# Patient Record
Sex: Male | Born: 1970
Health system: Southern US, Community
[De-identification: ages and names within clinical notes are randomized; demographics above are authoritative.]

## PROBLEM LIST (undated history)

## (undated) DIAGNOSIS — K219 Gastro-esophageal reflux disease without esophagitis: Secondary | ICD-10-CM

## (undated) DIAGNOSIS — K602 Anal fissure, unspecified: Secondary | ICD-10-CM

## (undated) DIAGNOSIS — G4730 Sleep apnea, unspecified: Secondary | ICD-10-CM

## (undated) DIAGNOSIS — T7840XA Allergy, unspecified, initial encounter: Secondary | ICD-10-CM

## (undated) HISTORY — DX: Gastro-esophageal reflux disease without esophagitis: K21.9

## (undated) HISTORY — PX: HAND SURGERY: SHX662

## (undated) HISTORY — PX: HERNIA REPAIR: SHX51

## (undated) HISTORY — PX: WISDOM TOOTH EXTRACTION: SHX21

## (undated) HISTORY — DX: Sleep apnea, unspecified: G47.30

## (undated) HISTORY — DX: Anal fissure, unspecified: K60.2

## (undated) HISTORY — DX: Allergy, unspecified, initial encounter: T78.40XA

---

## 2003-01-08 HISTORY — PX: HERNIA REPAIR: SHX51

## 2004-01-12 ENCOUNTER — Ambulatory Visit (HOSPITAL_COMMUNITY): Admission: RE | Admit: 2004-01-12 | Discharge: 2004-01-12 | Payer: Self-pay | Admitting: Surgery

## 2009-01-07 HISTORY — PX: UPPER GASTROINTESTINAL ENDOSCOPY: SHX188

## 2010-11-09 ENCOUNTER — Encounter: Payer: Self-pay | Admitting: Internal Medicine

## 2010-12-19 ENCOUNTER — Ambulatory Visit (INDEPENDENT_AMBULATORY_CARE_PROVIDER_SITE_OTHER): Payer: Federal, State, Local not specified - PPO | Admitting: Internal Medicine

## 2010-12-19 ENCOUNTER — Encounter: Payer: Self-pay | Admitting: Internal Medicine

## 2010-12-19 VITALS — BP 120/78 | HR 64 | Ht 69.0 in | Wt 193.0 lb

## 2010-12-19 DIAGNOSIS — R1310 Dysphagia, unspecified: Secondary | ICD-10-CM

## 2010-12-19 DIAGNOSIS — K219 Gastro-esophageal reflux disease without esophagitis: Secondary | ICD-10-CM

## 2010-12-19 NOTE — Progress Notes (Signed)
Cole Jones 10/16/1970 MRN [id number]    History of Present Illness:  This is a 40 year old white male with chronic gastroesophageal reflux and a globus sensation in the back of his throat which he describes as mucus or foamy secretions which he has to spit out occasionally. Solid food sometimes hesitates but he denies any dysphagia to liquids. She was initially treated with Prilosec 20 mg daily with complete relief of symptoms but at the advice of his physician, he switched  to Zantac 150 mg once a day and subsequently twice a day to avoid the risk of osteoporosis. He has a positive family history of gastroesophageal reflux in his father, brother and grandfather. 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 He moved here from [place], [place] several years ago. An upper endoscopy showed mild reflux esophagitis with no stricture.  Past Medical History  Diagnosis Date  . GERD (gastroesophageal reflux disease)   . Anal fissure     Hx of    Past Surgical History  Procedure Date  . Hernia repair     reports that he has quit smoking. He has never used smokeless tobacco. He reports that he drinks alcohol. He reports that he does not use illicit drugs. family history is negative for Colon cancer. No Known Allergies      Review of Systems: positive for solid food dysphagia and occasional heartburn. Denies abdominal pain by this epigastric discomfort. No change in bowel habits  The remainder of the 10 point ROS is negative except as outlined in H&P   Physical Exam: General appearance  Well developed, in no distress. Eyes- non icteric. HEENT nontraumatic, normocephalic. Mouth no lesions, tongue papillated, no cheilosis. Neck supple without adenopathy, thyroid not enlarged, no carotid bruits, no JVD. Lungs Clear to auscultation bilaterally. Cor normal S1, normal S2, regular rhythm, no murmur,  quiet precordium. Abdomen:  Both with minimal  tenderness in epigastrium. Normal active bowel sounds. No distention. No mass. Liver edge at costal margin. Rectal: not done Extremities no pedal edema. Skin no lesions. Neurological alert and oriented x 3. Psychological normal mood and affect.  Assessment and Plan:  Problem #1 Chronic gastroesophageal reflux is likely causing globus sensation. We need to rule out Schatzki's ring, esophageal web or stricture. We have discussed a barium esophagram to assess his motility. We need to rule out stricture and also assess the magnitude of his reflux. He discussed the use of PPIs as opposed to Zantac. We will go ahead with a barium esophagram and depending on the results may switch him to PPI.   12/19/2010 Lina Sar

## 2010-12-19 NOTE — Patient Instructions (Signed)
You have been scheduled for a Barium Esophogram at [facility] (1st floor of the hospital) on 01/10/11 at 9:30 am. Please arrive 15 minutes prior to your appointment for registration. Make certain not to have anything to eat or drink 6 hours prior to your test. If you need to reschedule for any reason, please contact radiology at [phone] to do so.  Diet for GERD  Nutrition therapy can help ease the discomfort of gastroesophageal reflux disease (GERD) and peptic ulcer disease (PUD).  HOME CARE INSTRUCTIONS   Eat your meals slowly, in a relaxed setting.   Eat 5 to 6 small meals per day.   If a food causes distress, stop eating it for a period of time.  FOODS TO AVOID  Coffee, regular or decaffeinated.   Cola beverages, regular or low calorie.   Tea, regular or decaffeinated.   Pepper.   Cocoa.   High fat foods, including meats.   Butter, margarine, hydrogenated oil (trans fats).   Peppermint or spearmint (if you have GERD).   Fruits and vegetables if not tolerated.   Alcohol.   Nicotine (smoking or chewing). This is one of the most potent stimulants to acid production in the gastrointestinal tract.   Any food that seems to aggravate your condition.  If you have questions regarding your diet, ask your caregiver or a registered dietitian. TIPS  Lying flat may make symptoms worse. Keep the head of your bed raised 6 to 9 inches (15 to 23 cm) by using a foam wedge or blocks under the legs of the bed.   Do not lay down until 3 hours after eating a meal.   Daily physical activity may help reduce symptoms.  MAKE SURE YOU:   Understand these instructions.   Will watch your condition.   Will get help right away if you are not doing well or get worse.  Document Released: 12/24/2004 Document Revised: 09/05/2010 Document Reviewed: 05/09/2008 [facility] Patient Information 2012 [place], [place].

## 2011-01-10 ENCOUNTER — Ambulatory Visit (HOSPITAL_COMMUNITY)
Admission: RE | Admit: 2011-01-10 | Discharge: 2011-01-10 | Disposition: A | Discharge status: Home / Self Care | Payer: Federal, State, Local not specified - PPO | Source: Ambulatory Visit | Attending: Internal Medicine | Admitting: Internal Medicine

## 2011-01-10 DIAGNOSIS — K219 Gastro-esophageal reflux disease without esophagitis: Secondary | ICD-10-CM | POA: Insufficient documentation

## 2011-01-10 DIAGNOSIS — R1310 Dysphagia, unspecified: Secondary | ICD-10-CM | POA: Insufficient documentation

## 2011-01-12 ENCOUNTER — Ambulatory Visit (INDEPENDENT_AMBULATORY_CARE_PROVIDER_SITE_OTHER): Payer: Federal, State, Local not specified - PPO

## 2011-01-12 ENCOUNTER — Telehealth: Payer: Self-pay | Admitting: Internal Medicine

## 2011-01-12 DIAGNOSIS — S239XXA Sprain of unspecified parts of thorax, initial encounter: Secondary | ICD-10-CM

## 2011-01-12 DIAGNOSIS — R079 Chest pain, unspecified: Secondary | ICD-10-CM

## 2011-01-12 DIAGNOSIS — S20219A Contusion of unspecified front wall of thorax, initial encounter: Secondary | ICD-10-CM

## 2011-01-12 NOTE — Telephone Encounter (Signed)
I have discussed the results of the UGI series with the pt. He will start taking Prilosec 20 mg po qd x 8 weeks and then call me back with the results. Please  send  Prilosec 20 mg, #30, 6 refills,to pt's pharmacy.

## 2011-01-14 MED ORDER — OMEPRAZOLE 20 MG PO CPDR: 20.0000 mg | DELAYED_RELEASE_CAPSULE | Freq: Every day | ORAL | Status: DC

## 2011-01-14 NOTE — Telephone Encounter (Signed)
Rx sent to pharmacy   

## 2011-01-14 NOTE — Telephone Encounter (Signed)
Addended by: Chrystie Nose R on: 01/14/2011 09:51 AM   Modules accepted: Orders

## 2011-03-18 ENCOUNTER — Telehealth: Payer: Self-pay | Admitting: Internal Medicine

## 2011-03-18 NOTE — Telephone Encounter (Signed)
Patient calling to let Dr. Juanda Chance know he is doing well with rx Prilosec.

## 2011-09-12 ENCOUNTER — Other Ambulatory Visit: Payer: Self-pay | Admitting: Internal Medicine

## 2012-02-11 ENCOUNTER — Ambulatory Visit: Payer: Federal, State, Local not specified - PPO | Admitting: Audiology

## 2012-03-17 ENCOUNTER — Telehealth: Payer: Self-pay | Admitting: Internal Medicine

## 2012-03-17 MED ORDER — OMEPRAZOLE 20 MG PO CPDR: 20.0000 mg | DELAYED_RELEASE_CAPSULE | Freq: Every day | ORAL | Status: DC

## 2012-03-17 NOTE — Telephone Encounter (Signed)
rx sent

## 2012-04-02 ENCOUNTER — Other Ambulatory Visit: Payer: Self-pay | Admitting: Internal Medicine

## 2012-08-06 ENCOUNTER — Other Ambulatory Visit: Payer: Self-pay | Admitting: *Deleted

## 2012-08-06 MED ORDER — OMEPRAZOLE 20 MG PO CPDR: 20.0000 mg | DELAYED_RELEASE_CAPSULE | Freq: Every day | ORAL | Status: DC

## 2012-11-10 ENCOUNTER — Other Ambulatory Visit: Payer: Self-pay | Admitting: Internal Medicine

## 2012-12-17 ENCOUNTER — Other Ambulatory Visit: Payer: Self-pay | Admitting: Internal Medicine

## 2012-12-22 ENCOUNTER — Other Ambulatory Visit: Payer: Self-pay | Admitting: Internal Medicine

## 2012-12-22 ENCOUNTER — Telehealth: Payer: Self-pay | Admitting: Internal Medicine

## 2012-12-22 MED ORDER — OMEPRAZOLE 20 MG PO CPDR: DELAYED_RELEASE_CAPSULE | ORAL | Status: DC

## 2012-12-22 NOTE — Telephone Encounter (Signed)
rx sent. Patient must keep 01/2013 office visit for further refills.

## 2013-02-05 ENCOUNTER — Ambulatory Visit (INDEPENDENT_AMBULATORY_CARE_PROVIDER_SITE_OTHER): Payer: Federal, State, Local not specified - PPO | Admitting: Internal Medicine

## 2013-02-05 ENCOUNTER — Encounter: Payer: Self-pay | Admitting: Internal Medicine

## 2013-02-05 VITALS — BP 120/80 | HR 76 | Ht 69.0 in | Wt 200.6 lb

## 2013-02-05 DIAGNOSIS — K219 Gastro-esophageal reflux disease without esophagitis: Secondary | ICD-10-CM

## 2013-02-05 MED ORDER — OMEPRAZOLE 20 MG PO CPDR: 20.0000 mg | DELAYED_RELEASE_CAPSULE | Freq: Every day | ORAL | Status: DC

## 2013-02-05 NOTE — Progress Notes (Signed)
Cole Jones [birth year] [id number]  Note: This dictation was prepared with Dragon digital system. Any transcriptional errors that result from this procedure are unintentional.   History of Present Illness:  This is a 43 year old white male with chronic gastroesophageal reflux which is under good control with Prilosec 20 mg daily. He rarely has breakthrough symptoms. He has gained about 7 pounds. His last upper endoscopy in August 2011 was in [place], [place], and showed a fibrous esophageal  ring but no metaplasia. A barium esophagram in December 2012 was normal with no evidence of reflux. A 13 mm tablet passed without delay.    Past Medical History  Diagnosis Date  . GERD (gastroesophageal reflux disease)   . Anal fissure     Hx of     Past Surgical History  Procedure Laterality Date  . Hernia repair      No Known Allergies  Family history and social history have been reviewed.  Review of Systems: Denies nocturnal cough dysphagia or chest pain  The remainder of the 10 point ROS is negative except as outlined in the H&P  Physical Exam: General Appearance Well developed, in no distress Eyes  Non icteric  HEENT  Non traumatic, normocephalic  Mouth No lesion, tongue papillated, no cheilosis Neck Supple without adenopathy, thyroid not enlarged, no carotid bruits, no JVD Lungs Clear to auscultation bilaterally COR Normal S1, normal S2, regular rhythm, no murmur, quiet precordium Extremities  No pedal edema Skin No lesions Neurological Alert and oriented x 3 Psychological Normal mood and affect  Assessment and Plan:   Problem #3 43 year old white male with gastroesophageal reflux disease which is under good control with Prilosec 20 mg daily. We will refill a 90 day supply for one year. He will follow antireflux measures including weight loss. We will see him in one year for refills. Problem #2 colorectal screening- at age 65   Delfin Edis 02/05/2013

## 2013-07-16 ENCOUNTER — Other Ambulatory Visit: Payer: Self-pay | Admitting: Urology

## 2013-07-28 ENCOUNTER — Encounter (HOSPITAL_COMMUNITY): Admission: RE | Payer: Self-pay | Source: Ambulatory Visit

## 2013-07-28 ENCOUNTER — Ambulatory Visit (HOSPITAL_COMMUNITY)
Admission: RE | Admit: 2013-07-28 | Payer: Federal, State, Local not specified - PPO | Source: Ambulatory Visit | Admitting: Urology

## 2013-07-28 SURGERY — REMOVAL, CONDYLOMA
Anesthesia: General

## 2013-11-14 ENCOUNTER — Other Ambulatory Visit: Payer: Self-pay | Admitting: Internal Medicine

## 2014-02-16 ENCOUNTER — Other Ambulatory Visit: Payer: Self-pay | Admitting: Internal Medicine

## 2014-02-17 ENCOUNTER — Other Ambulatory Visit: Payer: Self-pay | Admitting: Internal Medicine

## 2014-02-17 ENCOUNTER — Telehealth: Payer: Self-pay | Admitting: *Deleted

## 2014-02-17 NOTE — Telephone Encounter (Signed)
Called patient on 02/17/14 and scheduled a follow-up appointment for 03/25/14 at 3:30 pm with Dr. Olevia Perches. Refilled Rx for Omeprazole (PRILOSEC), 20 mg, 1 capsule, by mouth, every day. Quantity: 30; Refills: 1. Told patient I sent over Rx for him to pick up from [facility] drug store at [address] in [place], [place]. Patient stated they understood.

## 2014-02-17 NOTE — Telephone Encounter (Signed)
Sent Rx for Omeprazole (PRILOSEC), 20 mg, 1 capsule, every day to [facility] at [address] in [place], [place]. I called the patient to schedule their next appointment. Patient has a follow-up doctors appointment on 03/25/14 at 3:30 pm. Patient also know that their Rx was sent to their pharmacy. Patient stated they understood.

## 2014-03-25 ENCOUNTER — Encounter: Payer: Self-pay | Admitting: Internal Medicine

## 2014-03-25 ENCOUNTER — Ambulatory Visit (INDEPENDENT_AMBULATORY_CARE_PROVIDER_SITE_OTHER): Payer: Federal, State, Local not specified - PPO | Admitting: Internal Medicine

## 2014-03-25 VITALS — BP 120/76 | HR 68 | Ht 69.0 in | Wt 199.2 lb

## 2014-03-25 DIAGNOSIS — K219 Gastro-esophageal reflux disease without esophagitis: Secondary | ICD-10-CM

## 2014-03-25 MED ORDER — RANITIDINE HCL 150 MG PO TABS: 150.0000 mg | ORAL_TABLET | Freq: Two times a day (BID) | ORAL | Status: DC

## 2014-03-25 NOTE — Patient Instructions (Addendum)
We have sent the following medications to your pharmacy for you to pick up at your convenience:  Ranitidine Dr Joylene Draft

## 2014-03-25 NOTE — Progress Notes (Signed)
Cole Jones 08/17/1970 [id number]  Note: This dictation was prepared with Dragon digital system. Any transcriptional errors that result from this procedure are unintentional.   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 Last appointment January 2015. He had an endoscopy in [place] ,[place] in August 2011, no Barrett's esophagus.found.Marland Kitchen He is here today because of concerns for long-term effects of PPIs specifically renal insufficiency and Alzheimer's disease. He denies nocturnal cough, dysphagia, dyspepsia or regurgitation. He has been aware of mucus in the back of his throat which results in having to  clear his throat periodically. Barium esophagram in December 2012 was normal. 13 mm tablet passed without delay.    Past Medical History  Diagnosis Date  . GERD (gastroesophageal reflux disease)   . Anal fissure     Hx of     Past Surgical History  Procedure Laterality Date  . Hernia repair      No Known Allergies  Family history and social history have been reviewed.  Review of Systems: Normal bowel habits. History of anal fissure. Takes Metamucil daily. Positive for weight gain  The remainder of the 10 point ROS is negative except as outlined in the H&P  Physical Exam: General Appearance Well developed, in no distress Eyes  Non icteric  HEENT  Non traumatic, normocephalic  Mouth No lesion, tongue papillated, no cheilosis, normal voice Neck Supple without adenopathy, thyroid not enlarged, no carotid bruits, no JVD Lungs Clear to auscultation bilaterally COR Normal S1, normal S2, regular rhythm, no murmur, quiet precordium Abdomen soft. Nontender. Normoactive bowel sounds. Liver edge at costal margin Rectal not done Extremities  No pedal edema Skin No lesions Neurological Alert and oriented x 3 Psychological Normal mood and affect  Assessment and Plan:    44 year old white male with GERD,  under control .Pt having concerns about long term  Effects of PPI's. We will switch to H2 receptor antagonist, ranitidine 150 mg twice a day . He will continue to follow  antireflux measures specifically he needs to lose some weight.. He will let us know if his symptoms are not controlled on ranitidine.  Colorectal screening. At age 20 we will recommend screening colonoscopy    Delfin Edis 03/25/2014

## 2015-05-17 DIAGNOSIS — K080 Exfoliation of teeth due to systemic causes: Secondary | ICD-10-CM | POA: Diagnosis not present

## 2015-11-27 DIAGNOSIS — K080 Exfoliation of teeth due to systemic causes: Secondary | ICD-10-CM | POA: Diagnosis not present

## 2016-05-17 DIAGNOSIS — Z0000 Encounter for general adult medical examination without abnormal findings: Secondary | ICD-10-CM | POA: Diagnosis not present

## 2016-05-17 DIAGNOSIS — Z125 Encounter for screening for malignant neoplasm of prostate: Secondary | ICD-10-CM | POA: Diagnosis not present

## 2016-05-17 DIAGNOSIS — E784 Other hyperlipidemia: Secondary | ICD-10-CM | POA: Diagnosis not present

## 2016-05-23 DIAGNOSIS — E784 Other hyperlipidemia: Secondary | ICD-10-CM | POA: Diagnosis not present

## 2016-05-23 DIAGNOSIS — G4709 Other insomnia: Secondary | ICD-10-CM | POA: Diagnosis not present

## 2016-05-23 DIAGNOSIS — Z0000 Encounter for general adult medical examination without abnormal findings: Secondary | ICD-10-CM | POA: Diagnosis not present

## 2016-05-23 DIAGNOSIS — M25561 Pain in right knee: Secondary | ICD-10-CM | POA: Diagnosis not present

## 2016-05-23 DIAGNOSIS — R0683 Snoring: Secondary | ICD-10-CM | POA: Diagnosis not present

## 2016-06-19 DIAGNOSIS — D1801 Hemangioma of skin and subcutaneous tissue: Secondary | ICD-10-CM | POA: Diagnosis not present

## 2016-06-19 DIAGNOSIS — D239 Other benign neoplasm of skin, unspecified: Secondary | ICD-10-CM | POA: Diagnosis not present

## 2016-06-19 DIAGNOSIS — L814 Other melanin hyperpigmentation: Secondary | ICD-10-CM | POA: Diagnosis not present

## 2016-06-19 DIAGNOSIS — B353 Tinea pedis: Secondary | ICD-10-CM | POA: Diagnosis not present

## 2016-10-15 DIAGNOSIS — Z6829 Body mass index (BMI) 29.0-29.9, adult: Secondary | ICD-10-CM | POA: Diagnosis not present

## 2016-10-15 DIAGNOSIS — B353 Tinea pedis: Secondary | ICD-10-CM | POA: Diagnosis not present

## 2017-04-17 DIAGNOSIS — K080 Exfoliation of teeth due to systemic causes: Secondary | ICD-10-CM | POA: Diagnosis not present

## 2017-06-30 DIAGNOSIS — J329 Chronic sinusitis, unspecified: Secondary | ICD-10-CM | POA: Diagnosis not present

## 2017-06-30 DIAGNOSIS — Z6828 Body mass index (BMI) 28.0-28.9, adult: Secondary | ICD-10-CM | POA: Diagnosis not present

## 2017-08-01 DIAGNOSIS — Z0000 Encounter for general adult medical examination without abnormal findings: Secondary | ICD-10-CM | POA: Diagnosis not present

## 2017-08-01 DIAGNOSIS — Z125 Encounter for screening for malignant neoplasm of prostate: Secondary | ICD-10-CM | POA: Diagnosis not present

## 2017-08-01 DIAGNOSIS — E7849 Other hyperlipidemia: Secondary | ICD-10-CM | POA: Diagnosis not present

## 2017-08-07 DIAGNOSIS — E7849 Other hyperlipidemia: Secondary | ICD-10-CM | POA: Diagnosis not present

## 2017-08-07 DIAGNOSIS — Z1389 Encounter for screening for other disorder: Secondary | ICD-10-CM | POA: Diagnosis not present

## 2017-08-07 DIAGNOSIS — M25561 Pain in right knee: Secondary | ICD-10-CM | POA: Diagnosis not present

## 2017-08-07 DIAGNOSIS — R0683 Snoring: Secondary | ICD-10-CM | POA: Diagnosis not present

## 2017-08-07 DIAGNOSIS — J302 Other seasonal allergic rhinitis: Secondary | ICD-10-CM | POA: Diagnosis not present

## 2017-08-07 DIAGNOSIS — Z0000 Encounter for general adult medical examination without abnormal findings: Secondary | ICD-10-CM | POA: Diagnosis not present

## 2017-09-25 ENCOUNTER — Encounter: Payer: Self-pay | Admitting: Neurology

## 2017-09-29 ENCOUNTER — Ambulatory Visit: Payer: Federal, State, Local not specified - PPO | Admitting: Neurology

## 2017-09-29 ENCOUNTER — Encounter: Payer: Self-pay | Admitting: Neurology

## 2017-09-29 VITALS — BP 139/83 | HR 65 | Ht 69.0 in | Wt 199.0 lb

## 2017-09-29 DIAGNOSIS — R0681 Apnea, not elsewhere classified: Secondary | ICD-10-CM

## 2017-09-29 DIAGNOSIS — E663 Overweight: Secondary | ICD-10-CM | POA: Diagnosis not present

## 2017-09-29 DIAGNOSIS — R0683 Snoring: Secondary | ICD-10-CM | POA: Diagnosis not present

## 2017-09-29 DIAGNOSIS — K219 Gastro-esophageal reflux disease without esophagitis: Secondary | ICD-10-CM

## 2017-09-29 DIAGNOSIS — G4730 Sleep apnea, unspecified: Secondary | ICD-10-CM | POA: Insufficient documentation

## 2017-09-29 NOTE — Patient Instructions (Signed)
Sleep Study  Cole Jones . You will  Arrive between 8 and 9 PM, leave next morning between 5:00 a.m - 7:00 a.m.  The sleep study consists of a recording of your brain waves (EEG). Breathing, heart rate and rhythm (ECG), oxygen level, eye movement, and leg movement.  The technician will glue or or paste several electrodes to your scalp, face, chest and legs.  You will have belts around your chest and abdomen to record breathing and a finger clasp to check blood oxygen levels.  A tube at your mouth and nose will detect airflow.  There are no needle sticks or painful procedures of any sort.  You will have your own room, and we will make every effort to attend to your comfort and privacy.  Please prepare for your study by the following steps:   Please avoid coffee, tea, soda, chocolate and other caffeine foods or beverages after 12:00 noon on the day of your sleep study.   You must arrive with clean (no oils), conditioners or make up, and please make sure that you wash your hair to ensure that your hair and scalp are clean, dry and free of any hair extensions on the day of your study.  This will help to get a good reading of study.  Please try not to nap on the day of your study.  Please bring a list of all your medications.  Bring any medications that you might need during the time you are within the laboratory, including insulin, sleeping pills, pain medication and anxiety medications.  Bring snacks, water or juice  Please bring clothes to sleep in and your normal overnight bag.  Please leave valuable at home, as we will not be responsible for any lost items.  If you have any further questions, please feel free to call our office. Thank you

## 2017-09-29 NOTE — Progress Notes (Signed)
SLEEP MEDICINE CLINIC   Provider:  Larey Seat, M.D.    Primary Care Physician:  Crist Infante, MD   Referring Provider: Crist Infante, MD    Chief Complaint  Patient presents with  . New Patient (Initial Visit)    pt with wife, rm 61. pt states that for several years he has been snoring. over the last few years the snoring has worsened. never had a sleep study before. wife noted there was some irregular breathing.     HPI:  Cole Jones is a 47 y.o. male patient seen on 09-29-2017, seen here  in a referral from Dr. Joylene Draft.     Chief complaint according to patient : Cole Jones is a 47 year old Caucasian right-handed male seen here today in the presence of his wife, who has been witnessed to most of his sleep breathing.  She noted onset of snoring about 5 years ago but it has progressed since.  Initially the snoring seemed to get better when Cole Jones would roll on his side and avoid supine sleep.  Now it seems that that alone is not enough to stop snoring.  Cole Jones has also noted a rather irregular sleep breathing pattern.   Sleep habits are as follows: dinner time is around 6 PM, and usually without alcohol.  No physical activity in PM after dinner, mostly watching Tv. The children go to bed between 8 and 9 PM. The couples bedtime is 11, before midnight.  Cole Jones feels usually not sleepy before midnight. The bedroom is described as quiet and dark, there is no TV in the bedroom.  Temperature is set at 72 degrees.  He sleeps with one firm pillow for head support and another pillow to allow a comfortable position for the knees.  The bed itself is not adjusted.  He is usually asleep rather promptly and he will sleep through for several hours, he often has no bathroom breaks at night and may be one around 5 or 530 an hour before he has to rise any way.  He wakes at 6.30 AM, often awake before the alarm rings.sometimes with a dry mouth but denies any headaches, he  endorsed acid reflux, but usually no coughing, no sinus problems.  He may have nasal congestion, is allergic to oak pollen. On Loratadine bid. He is not tired.   Sleep medical history and family sleep history: sleep walker in childhood, sleep talker. He had migraines.  Family history of OSA- father , positional apnea, lost 30 pounds.    Social history: married, 2 school age children. Non smoker, ETOH- wine or beer 3-4 evenings per week. 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  Caffeine : 16 ounces of coffee, no iced tea, hot tea, sodas.  Review of Systems: Out of a complete 14 system review, the patient complains of only the following symptoms, and all other reviewed systems are negative.  Snoring.   Epworth score 2/ 24  , Fatigue severity score 12/ 63  , depression score.   Social History   Socioeconomic History  . Marital status: Married    Spouse name: Not on file  . Number of children: 1  . Years of education: Not on file  . Highest education level: Not on file  Occupational History  . Occupation: Chartered loss adjuster  . Financial resource strain: Not on file  . Food insecurity:    Worry: Not on file    Inability: Not on file  . Transportation needs:  Medical: Not on file    Non-medical: Not on file  Tobacco Use  . Smoking status: Former Research scientist (life sciences)  . Smokeless tobacco: Never Used  Substance and Sexual Activity  . Alcohol use: Yes    Comment: 3-4 a week   . Drug use: No  . Sexual activity: Not on file  Lifestyle  . Physical activity:    Days per week: Not on file    Minutes per session: Not on file  . Stress: Not on file  Relationships  . Social connections:    Talks on phone: Not on file    Gets together: Not on file    Attends religious service: Not on file    Active member of club or organization: Not on file    Attends meetings of clubs or organizations: Not on file    Relationship status: Not on file  . Intimate partner violence:    Fear of current or  ex partner: Not on file    Emotionally abused: Not on file    Physically abused: Not on file    Forced sexual activity: Not on file  Other Topics Concern  . Not on file  Social History Narrative  . Not on file    Family History  Problem Relation Age of Onset  . Osteopenia Mother   . Prostate cancer Father   . Breast cancer Maternal Grandmother   . Dementia Paternal Grandmother   . Heart attack Paternal Grandfather   . Colon cancer Neg Hx     Past Medical History:  Diagnosis Date  . Anal fissure    Hx of   . GERD (gastroesophageal reflux disease)     Past Surgical History:  Procedure Laterality Date  . HAND SURGERY Left   . HERNIA REPAIR      Current Outpatient Medications  Medication Sig Dispense Refill  . Cholecalciferol (VITAMIN D) 2000 UNITS CAPS Take 1 capsule by mouth daily.    Marland Kitchen FIBER PO Take by mouth daily.      . fluticasone (FLONASE) 50 MCG/ACT nasal spray Place 2 sprays into both nostrils daily as needed.     . Multiple Vitamin (MULTIVITAMIN) tablet Take 1 tablet by mouth daily.    . ranitidine (ZANTAC) 150 MG tablet Take 1 tablet (150 mg total) by mouth 2 (two) times daily. 180 tablet 3   No current facility-administered medications for this visit.     Allergies as of 09/29/2017  . (No Known Allergies)    Vitals: BP 139/83   Pulse 65   Ht 5\' 9"  (1.753 m)   Wt 199 lb (90.3 kg)   BMI 29.39 kg/m  Last Weight:  Wt Readings from Last 1 Encounters:  09/29/17 199 lb (90.3 kg)   GUY:QIHK mass index is 29.39 kg/m.     Last Height:   Ht Readings from Last 1 Encounters:  09/29/17 5\' 9"  (1.753 m)    Physical exam:  General: The patient is awake, alert and appears not in acute distress. The patient is well groomed. Head: Normocephalic, atraumatic. Neck is supple. Mallampati 2  neck circumference:16. Nasal airflow patent ,  Retrognathia is seen.  Cardiovascular:  Regular rate and rhythm , without  murmurs or carotid bruit, and without distended neck  veins. Respiratory: Lungs are clear to auscultation. Skin:  Without evidence of edema, or rash Trunk: BMI is 29.39. The patient's posture is erect.   Neurologic exam : The patient is awake and alert, oriented to place and time.  Memory subjective described as intact.  Attention span & concentration ability appears normal.  Speech is fluent,  without  dysarthria, dysphonia or aphasia.  Mood and affect are appropriate.  Cranial nerves: Pupils are equal and briskly reactive to light. Funduscopic exam without evidence of pallor or edema. Extraocular movements  in vertical and horizontal planes intact and without nystagmus. Visual fields by finger perimetry are intact. Hearing to finger rub intact.  Facial sensation intact to fine touch. Facial motor strength is symmetric and tongue and uvula move midline. Shoulder shrug was symmetrical.   Motor exam:  Normal tone, muscle bulk and symmetric strength in all extremities. Sensory:  Fine touch, pinprick and vibration were  normal. Coordination:Finger-to-nose maneuver  normal without evidence of ataxia, dysmetria or tremor. Gait and station: Patient walks without assistive device. Deep tendon reflexes: in the upper and lower extremities are symmetric and intact. Brisk patella reflexes.    Assessment:  After physical and neurologic examination, review of laboratory studies,  Personal review of imaging studies, reports of other /same  Imaging studies, results of polysomnography and / or neurophysiology testing and pre-existing records as far as provided in visit., my assessment is :   1)  No hypersomnia, no insomnia. Just witnessed snoring , possibly apnea witnessed.   2)  Go to PSG- SPLIT .    The patient was advised of the nature of the diagnosed disorder , the treatment options and the  risks for general health and wellness arising from not treating the condition.   I spent more than 45 minutes of face to face time with the patient.  Greater  than 50% of time was spent in counseling and coordination of care. We have discussed the diagnosis and differential and I answered the patient's questions.    Plan:  Treatment plan and additional workup :  PSG.      Larey Seat, MD  1/61/0960, 4:54 AM  Certified in Neurology by ABPN Certified in [place] by [facility] [address], [place], [place] [address]

## 2017-10-09 ENCOUNTER — Telehealth: Payer: Self-pay

## 2017-10-09 DIAGNOSIS — R0683 Snoring: Secondary | ICD-10-CM

## 2017-10-09 DIAGNOSIS — K219 Gastro-esophageal reflux disease without esophagitis: Secondary | ICD-10-CM

## 2017-10-09 DIAGNOSIS — G4730 Sleep apnea, unspecified: Secondary | ICD-10-CM

## 2017-10-09 DIAGNOSIS — R0681 Apnea, not elsewhere classified: Secondary | ICD-10-CM

## 2017-10-09 DIAGNOSIS — E663 Overweight: Secondary | ICD-10-CM

## 2017-10-09 NOTE — Telephone Encounter (Signed)
[employer] federal will not approve in lab sleep study, does not meet criteria. Need HST order

## 2017-10-10 NOTE — Telephone Encounter (Signed)
Changed to HST

## 2017-11-12 ENCOUNTER — Ambulatory Visit (INDEPENDENT_AMBULATORY_CARE_PROVIDER_SITE_OTHER): Payer: Federal, State, Local not specified - PPO | Admitting: Neurology

## 2017-11-12 DIAGNOSIS — G4733 Obstructive sleep apnea (adult) (pediatric): Secondary | ICD-10-CM | POA: Diagnosis not present

## 2017-11-12 DIAGNOSIS — G4730 Sleep apnea, unspecified: Secondary | ICD-10-CM

## 2017-11-12 DIAGNOSIS — R0683 Snoring: Secondary | ICD-10-CM

## 2017-11-12 DIAGNOSIS — E663 Overweight: Secondary | ICD-10-CM

## 2017-11-12 DIAGNOSIS — R0681 Apnea, not elsewhere classified: Secondary | ICD-10-CM

## 2017-11-12 DIAGNOSIS — K219 Gastro-esophageal reflux disease without esophagitis: Secondary | ICD-10-CM

## 2017-11-20 NOTE — Procedures (Signed)
NAME:  Cole Jones                                                      DOB: [birth year] MEDICAL RECORD No:  [id number]                                         DOS:  11/12/2017 REFERRING PHYSICIAN: Crist Infante, MD STUDY PERFORMED: Home Sleep Study by apnea link  HISTORY:  Cole Jones is a 47 y.o. male patient seen on 09-29-2017 in the presence of his wife, who has been witness to most of his sleep breathing.  She noted onset of snoring about 5 years ago, but it has progressed since. Initially, the snoring seemed to get better when [name] would roll on his side and avoid supine sleep.  Now it is not enough to stop snoring. Mrs. Venditto has also noted a rather irregular sleep breathing pattern.  He has nasal congestion, rhinitis is allergy related, and trigger are oak pollen.  Epworth sleepiness score endorsed at 2/ 24/ points and the Fatigue severity score at 12/ 63 points. BMI: 29.3 kg/m2.  STUDY RESULTS:  Total Recording Time: 8 hours 53 minutes, valid test time 8 h 39 minutes. Total Apnea/Hypopnea Index (AHI):  14.9 /h; RDI: 17.2 /h. Average Oxygen Saturation: 95 %; Lowest Oxygen Saturation: 82 %.  Total Time in Oxygen Saturation below 89 %: 4.0 minutes.  Average Heart Rate: 51 bpm, NSR (between 45 and 80 bpm). IMPRESSION: Mild, mostly Obstructive Sleep Apnea associated with loud snoring. No clinically relevant hypoxemia. Apnea link does not report the sleep position in relation to AHI/ RDI.  RECOMMENDATION: Since [name] does not have subjective compliant related to his sleep apnea and snoring, the treatment will be more directed towards eliminating the snoring related sleep disturbance to his wife.  1) CPAP is the number one option: it will address apnea and snoring, but requires good nasal airway patency.  2) A dental device may be enough to reduce mild apnea without hypoxemia and will treat snoring if a CPAP is not appealing.  3) Improvement of nasal patency - better  airflow through the nose- will help to reduce snoring, as can weight loss and avoiding supine sleep. It will not completely alleviate the condition. I certify that I have reviewed the raw data recording prior to the issuance of this report in accordance with the standards of the American Academy of Sleep Medicine (AASM). Larey Seat, M.D.    11-20-2017    Medical Director of [facility] at [facility], accredited by the AASM. Diplomat of the ABPN and ABSM.

## 2017-11-20 NOTE — Addendum Note (Signed)
Addended by: Larey Seat on: 11/20/2017 06:02 PM   Modules accepted: Orders

## 2017-11-24 ENCOUNTER — Telehealth: Payer: Self-pay | Admitting: Neurology

## 2017-11-24 NOTE — Telephone Encounter (Signed)
I called pt. I advised pt that Dr. Brett Fairy reviewed their sleep study results and found that pt has mild sleep apnea. Dr. Brett Fairy recommends that pt attempt to try the CPAP first and if the patient is unable to tolerate the CPAP then we could try the dental device. I spent about 19 min on the phone and I went over what the auto CPAP does vs the dental device. The patient asked several questions and I was able to answer them for him. Educated on what his study shows and how we move forward.  Pt verbalized understanding of results. Pt states that he would like to think about this more and then call back with what he decides to do.  Advised the patient to inform the phone staff how to move forward. If the patinet decides to try the CPAP then what we do is send the order to DME company that accepts his insurance [employer] and then they will contact him to get him set up. Advised the patient that we need to schedule him for a CPAP follow up visit 31-90 days from the date of his call back to ensure he is seen for insurance purposes within that time frame. Advised the patient that he needs to make sure to use the cpap > 4 hrs each night to be considered compliant.   Informed the patient if he goes the dental device route then we would make a referral to a dentis that makes the dental device and then we send that to the rferral team who sends his information over to the dentist office and at that point the patient will get scheduled with them and follow care with them for the dental device. Pt verbalized understanding.

## 2017-11-24 NOTE — Telephone Encounter (Signed)
Pt has returned the call to [place], he is asking for a call back

## 2017-11-24 NOTE — Telephone Encounter (Signed)
-----   Message from Larey Seat, MD sent at 11/20/2017  6:02 PM EST ----- Total Apnea/Hypopnea Index (AHI): 14.9 /h; RDI: 17.2 /h. Average Oxygen Saturation: 95 %; Lowest Oxygen Saturation: 82 %.  Total Time in Oxygen Saturation below 89 %: 4.0 minutes.  Average Heart Rate: 51 bpm, NSR (between 45 and 80 bpm). IMPRESSION: Mild, mostly Obstructive Sleep Apnea associated with  loud snoring. No clinically relevant hypoxemia. Apnea link does  not report the sleep position in relation to AHI/ RDI.  RECOMMENDATION: Since Mr. Bulkley does not have subjective  compliant related to his sleep apnea and snoring, the treatment  will be more directed towards eliminating the snoring related  sleep disturbance to his wife.   1) CPAP is the number one option: it will address apnea and  snoring, but requires good nasal airway patency. I like for him to give this a try.  2) A dental device may be enough to reduce mild apnea without  hypoxemia and will treat snoring if a CPAP is not appealing.  3) Improvement of nasal patency - better airflow through the  nose- will help to reduce snoring, as can weight loss and  avoiding supine sleep. It will not completely alleviate the  condition.

## 2017-11-24 NOTE — Telephone Encounter (Signed)
Called patient to discuss sleep study results. No answer at this time. LVM for the patient to call back.   

## 2018-09-24 DIAGNOSIS — Z125 Encounter for screening for malignant neoplasm of prostate: Secondary | ICD-10-CM | POA: Diagnosis not present

## 2018-09-24 DIAGNOSIS — Z0000 Encounter for general adult medical examination without abnormal findings: Secondary | ICD-10-CM | POA: Diagnosis not present

## 2018-09-24 DIAGNOSIS — E7849 Other hyperlipidemia: Secondary | ICD-10-CM | POA: Diagnosis not present

## 2018-10-01 DIAGNOSIS — J302 Other seasonal allergic rhinitis: Secondary | ICD-10-CM | POA: Diagnosis not present

## 2018-10-01 DIAGNOSIS — E785 Hyperlipidemia, unspecified: Secondary | ICD-10-CM | POA: Diagnosis not present

## 2018-10-01 DIAGNOSIS — Z1331 Encounter for screening for depression: Secondary | ICD-10-CM | POA: Diagnosis not present

## 2018-10-01 DIAGNOSIS — B353 Tinea pedis: Secondary | ICD-10-CM | POA: Diagnosis not present

## 2018-10-01 DIAGNOSIS — R0683 Snoring: Secondary | ICD-10-CM | POA: Diagnosis not present

## 2018-10-01 DIAGNOSIS — Z0000 Encounter for general adult medical examination without abnormal findings: Secondary | ICD-10-CM | POA: Diagnosis not present

## 2018-10-07 ENCOUNTER — Other Ambulatory Visit: Payer: Self-pay | Admitting: Internal Medicine

## 2018-10-07 DIAGNOSIS — E785 Hyperlipidemia, unspecified: Secondary | ICD-10-CM

## 2018-10-27 ENCOUNTER — Telehealth: Payer: Self-pay | Admitting: Neurology

## 2018-10-27 ENCOUNTER — Other Ambulatory Visit: Payer: Self-pay | Admitting: Neurology

## 2018-10-27 DIAGNOSIS — R0683 Snoring: Secondary | ICD-10-CM

## 2018-10-27 DIAGNOSIS — D2222 Melanocytic nevi of left ear and external auricular canal: Secondary | ICD-10-CM | POA: Diagnosis not present

## 2018-10-27 DIAGNOSIS — D225 Melanocytic nevi of trunk: Secondary | ICD-10-CM | POA: Diagnosis not present

## 2018-10-27 DIAGNOSIS — L820 Inflamed seborrheic keratosis: Secondary | ICD-10-CM | POA: Diagnosis not present

## 2018-10-27 DIAGNOSIS — L821 Other seborrheic keratosis: Secondary | ICD-10-CM | POA: Diagnosis not present

## 2018-10-27 DIAGNOSIS — G4730 Sleep apnea, unspecified: Secondary | ICD-10-CM

## 2018-10-27 DIAGNOSIS — D2261 Melanocytic nevi of right upper limb, including shoulder: Secondary | ICD-10-CM | POA: Diagnosis not present

## 2018-10-27 DIAGNOSIS — E663 Overweight: Secondary | ICD-10-CM

## 2018-10-27 NOTE — Telephone Encounter (Signed)
Pt called and stated he would like for a referral to be put in for him for a dental device. Please advise.

## 2018-10-27 NOTE — Telephone Encounter (Signed)
Referral placed for dental device per pt

## 2018-11-09 ENCOUNTER — Other Ambulatory Visit: Payer: Self-pay

## 2018-11-09 DIAGNOSIS — Z20822 Contact with and (suspected) exposure to covid-19: Secondary | ICD-10-CM

## 2018-11-10 LAB — NOVEL CORONAVIRUS, NAA: SARS-CoV-2, NAA: NOT DETECTED

## 2018-11-30 ENCOUNTER — Other Ambulatory Visit: Payer: Self-pay

## 2018-11-30 DIAGNOSIS — Z20822 Contact with and (suspected) exposure to covid-19: Secondary | ICD-10-CM

## 2018-12-01 ENCOUNTER — Encounter: Payer: Self-pay | Admitting: Family Medicine

## 2018-12-01 ENCOUNTER — Telehealth: Payer: Self-pay | Admitting: Neurology

## 2018-12-01 ENCOUNTER — Other Ambulatory Visit: Payer: Self-pay

## 2018-12-01 ENCOUNTER — Ambulatory Visit: Payer: Self-pay

## 2018-12-01 ENCOUNTER — Ambulatory Visit (INDEPENDENT_AMBULATORY_CARE_PROVIDER_SITE_OTHER): Payer: Federal, State, Local not specified - PPO | Admitting: Family Medicine

## 2018-12-01 VITALS — BP 130/84 | HR 72 | Temp 98.0°F | Ht 69.0 in | Wt 215.0 lb

## 2018-12-01 DIAGNOSIS — M25562 Pain in left knee: Secondary | ICD-10-CM

## 2018-12-01 DIAGNOSIS — G4730 Sleep apnea, unspecified: Secondary | ICD-10-CM

## 2018-12-01 DIAGNOSIS — E663 Overweight: Secondary | ICD-10-CM

## 2018-12-01 DIAGNOSIS — R0683 Snoring: Secondary | ICD-10-CM

## 2018-12-01 DIAGNOSIS — M76899 Other specified enthesopathies of unspecified lower limb, excluding foot: Secondary | ICD-10-CM | POA: Diagnosis not present

## 2018-12-01 DIAGNOSIS — G4733 Obstructive sleep apnea (adult) (pediatric): Secondary | ICD-10-CM

## 2018-12-01 MED ORDER — DICLOFENAC SODIUM 1 % EX GEL: 4.0000 g | Freq: Four times a day (QID) | CUTANEOUS | 11 refills | Status: DC

## 2018-12-01 MED ORDER — TRAMADOL HCL 50 MG PO TABS: 50.0000 mg | ORAL_TABLET | Freq: Three times a day (TID) | ORAL | 0 refills | Status: DC | PRN

## 2018-12-01 NOTE — Patient Instructions (Addendum)
Thank you for coming in today.  Use the topical voltaren gel. It is also OTC if the insurance gets fussy. Use 4x daily for pain,  Ok to use knee brace if it helps.  Work on leg exercises.   Straight leg raises  Toe out straight leg raise Knee squeeze knee extension.  15-30 reps 2-3x daily.   If you need to ok to use knee immobilizer but it may cause more harm than it helps.

## 2018-12-01 NOTE — Progress Notes (Signed)
Subjective:    CC: Left knee Pain  HPI: Patient developed left superior medial knee pain yesterday while playing tennis.  He felt a sharp pain but was able to continue playing.  Yesterday evening after he finished playing tennis while sleeping he developed worsening pain.  The pain was quite severe at 4:00 in the morning limiting his ability to sleep.  He is tried over-the-counter medications which help a little.  Additionally he is tried a hinged knee brace that helps some as well.  He is able to walk but notes that ambulation is painful.  He points to his superior medial patellar pole as the area of maximum pain.  He denies any tearing sensation or popping sensation associated with the pain.  Additionally he denies any locking catching or giving way.    Past medical history, Surgical history, Family history not pertinant except as noted below, Social history, Allergies, and medications have been entered into the medical record, reviewed, and no changes needed.   Review of Systems: No headache, visual changes, nausea, vomiting, diarrhea, constipation, dizziness, abdominal pain, skin rash, fevers, chills, night sweats, weight loss, swollen lymph nodes, body aches, joint swelling, muscle aches, chest pain, shortness of breath, mood changes, visual or auditory hallucinations.   Objective:    Vitals:   12/01/18 0845  BP: 130/84  Pulse: 72  Temp: 98 F (36.7 C)  SpO2: 97%   General: Well Developed, well nourished, and in no acute distress.  Neuro/Psych: Alert and oriented x3, extra-ocular muscles intact, able to move all 4 extremities, sensation grossly intact. Skin: Warm and dry, no rashes noted.  Respiratory: Not using accessory muscles, speaking in full sentences, trachea midline.  Cardiovascular: Pulses palpable, no extremity edema. Abdomen: Does not appear distended. MSK:  Left knee: Normal-appearing with no severe deformity or bruising or severe effusion. Range of motion  0-120 degrees. Tender palpation at superior patellar pole and medial quad tendon insertion.  No palpable tendon defect present. Stable ligamentous exam. Intact strength to extension and flexion however extension does reproduce pain.  Contralateral right knee normal-appearing normal motion.   Lab and Radiology Results Limited musculoskeletal ultrasound left knee. Quad tendon insertion onto patella intact. No full-thickness quad tear. Calcification present at the middle superior patellar pole at the insertion of the quad tendon and at the medial superior patellar pole. Increased Doppler activity present at the medial superior patellar pole quad tendon insertion. Normal-appearing patellar tendon and medial and lateral joint line meniscus. Bony structures otherwise normal-appearing Impression: Insertional quadriceps tendinitis including calcification with acute partial tear at medial quad tendon insertion.  Impression and Recommendations:    Assessment and Plan: 48 y.o. male with acute left knee pain occurring yesterday when playing tennis.  Patient suffered a small quad tendon strain or tear predominantly located at the medial insertion.  Ultrasound he does have some calcifications present at the distal insertion of the quad tendon on the patella indicating chronic calcific tendinopathy.  However his pain yesterday is more of an acute process.  Fortunately he does not have significant tendon tear seen on ultrasound.  Additionally he has great strength.  Plan to treat with diclofenac gel eccentric exercises and stretching.  Continue hinged knee brace as this is helpful.  Limited tramadol as needed for severe pain sent to pharmacy.  If not improving in the next few weeks patient will notify me and will likely return to clinic for recheck reevaluation will consider nitroglycerin patches and formal physical therapy at that  time.  PDMP reviewed during this encounter. Orders Placed This Encounter   Procedures  . No Chg  [place] Lower Lt    Order Specific Question:   Reason for Exam (SYMPTOM  OR DIAGNOSIS REQUIRED)    Answer:   left knee pain    Order Specific Question:   Preferred imaging location?    Answer:   [facility] ordered this encounter  Medications  . diclofenac Sodium (VOLTAREN) 1 % GEL    Sig: Apply 4 g topically 4 (four) times daily. To affected joint.    Dispense:  100 g    Refill:  11  . traMADol (ULTRAM) 50 MG tablet    Sig: Take 1 tablet (50 mg total) by mouth every 8 (eight) hours as needed for severe pain.    Dispense:  7 tablet    Refill:  0    Discussed warning signs or symptoms. Please see discharge instructions. Patient expresses understanding.

## 2018-12-01 NOTE — Telephone Encounter (Signed)
Called the patient to discuss whether he started the dental device per conversation in the past. The dental device was not covered under his insurance they require that he try and fail CPAP first prior to covering a dental device. Advised that I would send everything over to the insurance company and see if this will be ok for him to get set up with a auto CPAP. Advised that it has slightly been over a yr since having the study completed and that usually if not started CPAP within a yr depending on the insurance he may have to restart the process. Informed that I dont know that for certain but wanted to give the heads up. I will send the order for him to [employer]. Reviewed CPAP compliance with the patient and advised that if they are able to start him on CPAP then we will need to make sure that we follow up with him in 31-90 days after starting the machine. Pt verbalized understanding.

## 2018-12-01 NOTE — Telephone Encounter (Signed)
Patient called to request a cpap machine. States he does not currently have one. Please follow up.

## 2018-12-02 LAB — NOVEL CORONAVIRUS, NAA: SARS-CoV-2, NAA: NOT DETECTED

## 2018-12-07 NOTE — Telephone Encounter (Signed)
recevied a notification because this is outside the 1 year window the patient will have to start the process over and we will need to first complete office visit. This can be with Dr Brett Fairy or NP just indicating that insurance denied access to dental device and the need to repeating the sleep study since over a yr old since las sleep study.      "Unfortunately his sleep study is over a year old, so he will need to be requalified per his insurance's guidelines."

## 2018-12-08 ENCOUNTER — Encounter: Payer: Self-pay | Admitting: Neurology

## 2018-12-23 DIAGNOSIS — K080 Exfoliation of teeth due to systemic causes: Secondary | ICD-10-CM | POA: Diagnosis not present

## 2019-01-13 ENCOUNTER — Encounter: Payer: Self-pay | Admitting: Family Medicine

## 2019-01-13 ENCOUNTER — Ambulatory Visit: Payer: Federal, State, Local not specified - PPO | Admitting: Family Medicine

## 2019-01-13 ENCOUNTER — Other Ambulatory Visit: Payer: Self-pay | Admitting: Neurology

## 2019-01-13 ENCOUNTER — Other Ambulatory Visit: Payer: Self-pay

## 2019-01-13 VITALS — BP 137/88 | HR 63 | Temp 98.0°F | Ht 69.0 in | Wt 218.6 lb

## 2019-01-13 DIAGNOSIS — G4733 Obstructive sleep apnea (adult) (pediatric): Secondary | ICD-10-CM

## 2019-01-13 DIAGNOSIS — R0683 Snoring: Secondary | ICD-10-CM

## 2019-01-13 NOTE — Progress Notes (Signed)
PATIENT: Cole Jones DOB: 1970-09-11  REASON FOR VISIT: follow up HISTORY FROM: patient  Chief Complaint  Patient presents with  . Follow-up    RM1. alone. Would like to utalize a CPAP machine.     HISTORY OF PRESENT ILLNESS: Today 01/13/19 Cole Jones is a 49 y.o. male here today for follow up for OSA. He was diagnosed with mild OSA without hypoxemia with HST in 11/2017. He pursued dental device but reports that insurance will not cover dental device unless CPAP is tried and failed. HST was greater than 1 year ago. He returns today to repeat HST. He notes some weight gain since working from home due to the pandemic. He does endorse worsening snoring and frequent waking.   HISTORY: (copied from Dr Dohmeier's note on 09/29/2017)  HPI:  Cole Jones is a 49 y.o. male patient seen on 09-29-2017, seen here  in a referral from Dr. Joylene Draft.  Chief complaint according to patient : Mr. Fitzke is a 49 year old Caucasian right-handed male seen here today in the presence of his wife, who has been witnessed to most of his sleep breathing. She noted onset of snoring about 5 years ago but it has progressed since.  Initially the snoring seemed to get better when Mr. Tien would roll on his side and avoid supine sleep.  Now it seems that that alone is not enough to stop snoring.  Mrs. Cherry has also noted a rather irregular sleep breathing pattern.  Sleep habits are as follows: dinner time is around 6 PM, and usually without alcohol.  No physical activity in PM after dinner, mostly watching Tv. The children go to bed between 8 and 9 PM. The couples bedtime is 11, before midnight.  Mr. Vanweelden feels usually not sleepy before midnight. The bedroom is described as quiet and dark, there is no TV in the bedroom.  Temperature is set at 72 degrees.  He sleeps with one firm pillow for head support and another pillow to allow a comfortable position for the knees.  The bed itself is not  adjusted.  He is usually asleep rather promptly and he will sleep through for several hours, he often has no bathroom breaks at night and may be one around 5 or 530 an hour before he has to rise any way.  He wakes at 6.30 AM, often awake before the alarm rings.sometimes with a dry mouth but denies any headaches, he endorsed acid reflux, but usually no coughing, no sinus problems.  He may have nasal congestion, is allergic to oak pollen. On Loratadine bid. He is not tired.   Sleep medical history and family sleep history: sleep walker in childhood, sleep talker. He had migraines.  Family history of OSA- father , positional apnea, lost 30 pounds.    Social history: married, 2 school age children. Non smoker, ETOH- wine or beer 3-4 evenings per week. 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  Caffeine : 16 ounces of coffee, no iced tea, hot tea, sodas.   REVIEW OF SYSTEMS: Out of a complete 14 system review of symptoms, the patient complains only of the following symptoms, and all other reviewed systems are negative.  ESS: 3 FSS:20   ALLERGIES: No Known Allergies  HOME MEDICATIONS: Outpatient Medications Prior to Visit  Medication Sig Dispense Refill  . Cholecalciferol (VITAMIN D) 2000 UNITS CAPS Take 1 capsule by mouth daily.    Marland Kitchen FIBER PO Take by mouth daily.      Marland Kitchen  fluticasone (FLONASE) 50 MCG/ACT nasal spray Place 2 sprays into both nostrils daily as needed.     . Multiple Vitamin (MULTIVITAMIN) tablet Take 1 tablet by mouth daily.    Marland Kitchen omeprazole (PRILOSEC) 20 MG capsule TK 1 C PO QD 10 TO 20 MIN AC    . Zolpidem Tartrate (AMBIEN PO) Take by mouth. States he has not been taking the [place], but has it just incase.    . diclofenac Sodium (VOLTAREN) 1 % GEL Apply 4 g topically 4 (four) times daily. To affected joint. (Patient not taking: Reported on 01/13/2019) 100 g 11  . traMADol (ULTRAM) 50 MG tablet Take 1 tablet (50 mg total) by mouth every 8 (eight) hours as needed for severe pain.  (Patient not taking: Reported on 01/13/2019) 7 tablet 0   No facility-administered medications prior to visit.    PAST MEDICAL HISTORY: Past Medical History:  Diagnosis Date  . Anal fissure    Hx of   . GERD (gastroesophageal reflux disease)     PAST SURGICAL HISTORY: Past Surgical History:  Procedure Laterality Date  . HAND SURGERY Left   . HERNIA REPAIR      FAMILY HISTORY: Family History  Problem Relation Age of Onset  . Osteopenia Mother   . Prostate cancer Father   . Breast cancer Maternal Grandmother   . Dementia Paternal Grandmother   . Heart attack Paternal Grandfather   . Colon cancer Neg Hx     SOCIAL HISTORY: Social History   Socioeconomic History  . Marital status: Married    Spouse name: Not on file  . Number of children: 1  . Years of education: Not on file  . Highest education level: Not on file  Occupational History  . Occupation: Attorney   Tobacco Use  . Smoking status: Former Research scientist (life sciences)  . Smokeless tobacco: Never Used  Substance and Sexual Activity  . Alcohol use: Yes    Comment: 3-4 a week   . Drug use: No  . Sexual activity: Not on file  Other Topics Concern  . Not on file  Social History Narrative  . Not on file   Social Determinants of Health   Financial Resource Strain:   . Difficulty of Paying Living Expenses: Not on file  Food Insecurity:   . Worried About Charity fundraiser in the Last Year: Not on file  . Ran Out of Food in the Last Year: Not on file  Transportation Needs:   . Lack of Transportation (Medical): Not on file  . Lack of Transportation (Non-Medical): Not on file  Physical Activity:   . Days of Exercise per Week: Not on file  . Minutes of Exercise per Session: Not on file  Stress:   . Feeling of Stress : Not on file  Social Connections:   . Frequency of Communication with Friends and Family: Not on file  . Frequency of Social Gatherings with Friends and Family: Not on file  . Attends Religious Services: Not on  file  . Active Member of Clubs or Organizations: Not on file  . Attends Archivist Meetings: Not on file  . Marital Status: Not on file  Intimate Partner Violence:   . Fear of Current or Ex-Partner: Not on file  . Emotionally Abused: Not on file  . Physically Abused: Not on file  . Sexually Abused: Not on file      PHYSICAL EXAM  Vitals:   01/13/19 1506  BP: 137/88  Pulse: 63  Temp: 98 F (36.7 C)  Weight: 218 lb 9.6 oz (99.2 kg)  Height: 5\' 9"  (1.753 m)   Body mass index is 32.28 kg/m.  Generalized: Well developed, in no acute distress  Neck circ 17.5", mallampati 3+ Cardiology: normal rate and rhythm, no murmur noted Respiratory: clear to auscultation bilaterally Neurological examination  Mentation: Alert oriented to time, place, history taking. Follows all commands speech and language fluent Cranial nerve II-XII: Pupils were equal round reactive to light. Extraocular movements were full, visual field were full  Motor: The motor testing reveals 5 over 5 strength of all 4 extremities. Good symmetric motor tone is noted throughout.  Gait and station: Gait is normal.   DIAGNOSTIC DATA (LABS, IMAGING, TESTING) - I reviewed patient records, labs, notes, testing and imaging myself where available.  No flowsheet data found.   No results found for: WBC, HGB, HCT, MCV, PLT No results found for: NA, K, CL, CO2, GLUCOSE, BUN, CREATININE, CALCIUM, PROT, ALBUMIN, AST, ALT, ALKPHOS, BILITOT, GFRNONAA, GFRAA No results found for: CHOL, HDL, LDLCALC, LDLDIRECT, TRIG, CHOLHDL No results found for: HGBA1C No results found for: VITAMINB12 No results found for: TSH     ASSESSMENT AND PLAN 49 y.o. year old male  has a past medical history of Anal fissure and GERD (gastroesophageal reflux disease). here with     ICD-10-CM   1. OSA (obstructive sleep apnea)  G47.33     Lillard presents today to repeat HST as last study is > than 1 year. He does feel that sleep apnea may  have worsened with weight gain. We will place orders today and anticipate CPAP initiation pending results. He will follow up with me in 31-90 days. He verbalizes understanding and agreement with this plan.    No orders of the defined types were placed in this encounter.    No orders of the defined types were placed in this encounter.     I spent 15 minutes with the patient. 50% of this time was spent counseling and educating patient on plan of care and medications.    Debbora Presto, FNP-C 01/13/2019, 3:12 PM [facility] [address] [place], [place] [address] [phone]

## 2019-01-13 NOTE — Patient Instructions (Signed)
We will order HST. Will order CPAP pending results.   Follow up in 31-90 days following CPAP start date.   Sleep Apnea Sleep apnea affects breathing during sleep. It causes breathing to stop for a short time or to become shallow. It can also increase the risk of:  Heart attack.  Stroke.  Being very overweight (obese).  Diabetes.  Heart failure.  Irregular heartbeat. The goal of treatment is to help you breathe normally again. What are the causes? There are three kinds of sleep apnea:  Obstructive sleep apnea. This is caused by a blocked or collapsed airway.  Central sleep apnea. This happens when the brain does not send the right signals to the muscles that control breathing.  Mixed sleep apnea. This is a combination of obstructive and central sleep apnea. The most common cause of this condition is a collapsed or blocked airway. This can happen if:  Your throat muscles are too relaxed.  Your tongue and tonsils are too large.  You are overweight.  Your airway is too small. What increases the risk?  Being overweight.  Smoking.  Having a small airway.  Being older.  Being male.  Drinking alcohol.  Taking medicines to calm yourself (sedatives or tranquilizers).  Having family members with the condition. What are the signs or symptoms?  Trouble staying asleep.  Being sleepy or tired during the day.  Getting angry a lot.  Loud snoring.  Headaches in the morning.  Not being able to focus your mind (concentrate).  Forgetting things.  Less interest in sex.  Mood swings.  Personality changes.  Feelings of sadness (depression).  Waking up a lot during the night to pee (urinate).  Dry mouth.  Sore throat. How is this diagnosed?  Your medical history.  A physical exam.  A test that is done when you are sleeping (sleep study). The test is most often done in a sleep lab but may also be done at home. How is this treated?   Sleeping on your  side.  Using a medicine to get rid of mucus in your nose (decongestant).  Avoiding the use of alcohol, medicines to help you relax, or certain pain medicines (narcotics).  Losing weight, if needed.  Changing your diet.  Not smoking.  Using a machine to open your airway while you sleep, such as: ? An oral appliance. This is a mouthpiece that shifts your lower jaw forward. ? A CPAP device. This device blows air through a mask when you breathe out (exhale). ? An EPAP device. This has valves that you put in each nostril. ? A BPAP device. This device blows air through a mask when you breathe in (inhale) and breathe out.  Having surgery if other treatments do not work. It is important to get treatment for sleep apnea. Without treatment, it can lead to:  High blood pressure.  Coronary artery disease.  In men, not being able to have an erection (impotence).  Reduced thinking ability. Follow these instructions at home: Lifestyle  Make changes that your doctor recommends.  Eat a healthy diet.  Lose weight if needed.  Avoid alcohol, medicines to help you relax, and some pain medicines.  Do not use any products that contain nicotine or tobacco, such as cigarettes, e-cigarettes, and chewing tobacco. If you need help quitting, ask your doctor. General instructions  Take over-the-counter and prescription medicines only as told by your doctor.  If you were given a machine to use while you sleep, use it only  as told by your doctor.  If you are having surgery, make sure to tell your doctor you have sleep apnea. You may need to bring your device with you.  Keep all follow-up visits as told by your doctor. This is important. Contact a doctor if:  The machine that you were given to use during sleep bothers you or does not seem to be working.  You do not get better.  You get worse. Get help right away if:  Your chest hurts.  You have trouble breathing in enough air.  You have  an uncomfortable feeling in your back, arms, or stomach.  You have trouble talking.  One side of your body feels weak.  A part of your face is hanging down. These symptoms may be an emergency. Do not wait to see if the symptoms will go away. Get medical help right away. Call your local emergency services (911 in the [place] Do not drive yourself to the hospital. Summary  This condition affects breathing during sleep.  The most common cause is a collapsed or blocked airway.  The goal of treatment is to help you breathe normally while you sleep. This information is not intended to replace advice given to you by your health care provider. Make sure you discuss any questions you have with your health care provider. Document Revised: 10/10/2017 Document Reviewed: 08/19/2017 Elsevier Patient Education  [place].

## 2019-01-20 ENCOUNTER — Ambulatory Visit: Payer: Federal, State, Local not specified - PPO | Admitting: Family Medicine

## 2019-01-27 ENCOUNTER — Ambulatory Visit (INDEPENDENT_AMBULATORY_CARE_PROVIDER_SITE_OTHER): Payer: Federal, State, Local not specified - PPO | Admitting: Neurology

## 2019-01-27 DIAGNOSIS — R0683 Snoring: Secondary | ICD-10-CM

## 2019-01-27 DIAGNOSIS — G4733 Obstructive sleep apnea (adult) (pediatric): Secondary | ICD-10-CM | POA: Diagnosis not present

## 2019-02-09 ENCOUNTER — Telehealth: Payer: Self-pay | Admitting: Neurology

## 2019-02-09 DIAGNOSIS — R0681 Apnea, not elsewhere classified: Secondary | ICD-10-CM

## 2019-02-09 DIAGNOSIS — G4733 Obstructive sleep apnea (adult) (pediatric): Secondary | ICD-10-CM | POA: Insufficient documentation

## 2019-02-09 DIAGNOSIS — K219 Gastro-esophageal reflux disease without esophagitis: Secondary | ICD-10-CM

## 2019-02-09 DIAGNOSIS — Z23 Encounter for immunization: Secondary | ICD-10-CM | POA: Diagnosis not present

## 2019-02-09 DIAGNOSIS — G4730 Sleep apnea, unspecified: Secondary | ICD-10-CM

## 2019-02-09 NOTE — Telephone Encounter (Signed)
Patient called back in regards to missed call please follow up

## 2019-02-09 NOTE — Telephone Encounter (Signed)
Patient called in for sleep study results

## 2019-02-09 NOTE — Telephone Encounter (Signed)
Dr Brett Fairy has not read his sleep study as of this moment. I will make her aware the patient is calling and call the patient as soon as she has read the study.  Thanks

## 2019-02-09 NOTE — Addendum Note (Signed)
Addended by: Larey Seat on: 02/09/2019 12:52 PM   Modules accepted: Orders

## 2019-02-09 NOTE — Telephone Encounter (Signed)
I called pt. I advised pt that Dr. Brett Fairy reviewed their sleep study results and found that pt has severe sleep apnea. Dr. Brett Fairy recommends that pt starts auto CPAP. I reviewed PAP compliance expectations with the pt. Pt is agreeable to starting a CPAP. I advised pt that an order will be sent to a DME, [facility], and [facility] will call the pt within about one week after they file with the pt's insurance. [facility] will show the pt how to use the machine, fit for masks, and troubleshoot the CPAP if needed. A follow up appt was made for insurance purposes with Debbora Presto, NP on April 7,2021 at 9 am. Pt verbalized understanding to arrive 15 minutes early and bring their CPAP. A letter with all of this information in it will be mailed to the pt as a reminder. I verified with the pt that the address we have on file is correct. Pt verbalized understanding of results. Pt had no questions at this time but was encouraged to call back if questions arise. I have sent the order to [facility] and have received confirmation that they have received the order.

## 2019-02-09 NOTE — Progress Notes (Signed)
Summary & Diagnosis:   We found Severe Sleep Apnea (OSA) at AHI of 50.8/h and REM sleep  AHI was 78.7/h. Non positional apnea associated with loud  snoring but not with hypoxia. Trend to bradycardia was noted.   Recommendations:    This HST result is very different from [place] in Winter 2019. Such  severe apnea with REM dependence has developed, that CPAP is now  the only treatment option.  BMI was 32.3 kg/m2 at this time and was 29.3 kg/m2 in 2019- I  find this AHI variation very unexpected and questionable - If  patient is willing to further investigate the difference, I will  invite him to an attended CPAP study with 1 hour baseline, to  confirm that HST results are correct.  If we cannot find insurance authorization , I will order an  autotitration device instead and begin therapy at 5-18 cm water  pressure, 2 cm EPR, heated humidity, and mask of patient's  choice.     Interpreting Physician: Larey Seat, MD

## 2019-02-09 NOTE — Telephone Encounter (Signed)
I am waiting for Cole Jones to check why the [facility] 01-27-2019 was not on data list. CD

## 2019-02-09 NOTE — Procedures (Signed)
Patient Information     First Name: Cole Last Name: Jones ID: [id number]  Birth Date: 15-Sep-1970 Age: 49 Gender: Male  Referring Provider: Crist Infante, MD BMI: 32.3 (W=218 lb, H=5' 9'')  Neck Circ.:  17.5" Epworth:  3/24   Sleep Study Information    Study Date: Jan 27, 2019 S/H/A Version: [id number] / 4.1.1528 / 77  History:    Cole Jones is a 49 y.o. male here today for follow up for OSA. He was diagnosed with mild OSA without hypoxemia by a HST in 11/2017. He pursued dental device but reports that insurance will not cover dental device unless CPAP is tried and failed.  HST was greater than 1 year ago. He returns today to repeat HST. He notes some weight gain since working from home due to the pandemic. He does endorse worsening snoring and frequent waking.       Summary & Diagnosis:    We found Severe Sleep Apnea (OSA)  at AHI of 50.8/h and REM sleep AHI was 78.7/h. Non positional apnea associated  with loud snoring but not with hypoxia. Trend to bradycardia was noted.   Recommendations:     This HST result is very different from [place] in Winter 2019. Such severe apnea with REM dependence has developed, that CPAP is now the only treatment option. BMI was 32.3 kg/m2 at this time and was 29.3 kg/m2  in 2019- I find this AHI variation very unexpected and questionable - If patient is willing to further investigate the difference, I will invite him to an attended CPAP study with 1 hour baseline, to confirm that HST results are correct. If we cannot find insurance authorization , I will order an autotitration device instead and begin therapy at 5-18 cm water pressure, 2 cm EPR, heated humidity, and mask of patient's choice.      Interpreting Physician: Larey Seat, MD            Sleep Summary  Oxygen Saturation Statistics   Start Study Time: End Study Time: Total Recording Time:       11:20:56 PM 6:55:57 AM 7 h, 35 min  Total Sleep Time % REM of Sleep Time:  6  h, 45 min 25.1    Mean: 93 Minimum: 82 Maximum: 98  Mean of Desaturations Nadirs (%):   91  Oxygen Desaturation. %:   4-9 10-20 >20 Total  Events Number Total   172  12 93.5 6.5  0 0.0  184 100.0  Oxygen Saturation: <90 <=88 <85 <80 <70  Duration (minutes): Sleep % 7.4 1.8  3.7 0.6  0.9 0.2 0.0 0.0 0.0 0.0     Respiratory Indices      Total Events REM NREM All Night  pRDI:  346  pAHI:  331 ODI:  184  pAHIc:  6  % CSR: 0.0 78.7 78.7 59.6 2.0 45.2 42.2 18.6 0.6 53.1 50.8 28.2 0.9       Pulse Rate Statistics during Sleep (BPM)      Mean:  54 Minimum: 43 Maximum: 91    Indices are calculated using technically valid sleep time of  6 h, 31 min. pRDI/pAHI are calculated using 02i desaturations ? 3%  Body Position Statistics  Position Supine Prone Right Left Non-Supine  Sleep (min) 171.5 142.0 2.0 89.5 233.5  Sleep % 42.3 35.1 0.5 22.1 57.7  pRDI 53.4 50.6 N/A 55.7 52.8  pAHI 50.2 49.3 N/A 53.7 51.1  ODI 31.4 23.8 N/A 27.5 25.8  Snoring Statistics Snoring Level (dB) >40 >50 >60 >70 >80 >Threshold (45)  Sleep (min) 316.6 99.0 2.7 0.8 0.0 193.3  Sleep % 78.2 24.4 0.7 0.2 0.0 47.7    Mean: 46 dB

## 2019-02-09 NOTE — Addendum Note (Signed)
Addended by: Larey Seat on: 02/09/2019 12:30 PM   Modules accepted: Orders

## 2019-02-09 NOTE — Telephone Encounter (Signed)
HST was read - and results posted :  NAME:  Cole Jones                                                      DOB: 1970/09/19 MEDICAL RECORD No:  [id number]                                         DOS:  11/12/2017 REFERRING PHYSICIAN: Crist Infante, MD STUDY PERFORMED: Home Sleep Study by apnea link  HISTORY: [name] a 49 y.o.malepatient seen on 09-29-2017 in the presence of his wife, who has been witness to most of his sleep breathing. She noted onset of snoring about 5 years ago, but it has progressed since. Initially, the snoring seemed to get better when [name] would roll on his side and avoid supine sleep.Now it is not enough to stop snoring. [name] has also noted a rather irregular sleep breathing pattern. He has nasal congestion, rhinitis is allergy related, and trigger are oak pollen.  Epworth sleepiness scoreendorsed at 2/ 24/ points and the Fatigue severity score at 12/ 63 points. BMI: 29.3 kg/m2.  STUDY RESULTS:  Total Recording Time: 8 hours 53 minutes, valid test time 8 h 39 minutes. Total Apnea/Hypopnea Index (AHI):  14.9 /h; RDI: 17.2 /h. Average Oxygen Saturation: 95 %; Lowest Oxygen Saturation: 82 %.  Total Time in Oxygen Saturation below 89 %: 4.0 minutes.  Average Heart Rate: 51 bpm, NSR (between 45 and 80 bpm). IMPRESSION: Mild, mostly Obstructive Sleep Apnea associated with loud snoring. No clinically relevant hypoxemia. Apnea link does not report the sleep position in relation to AHI/ RDI.  RECOMMENDATION: Since [name] does not have subjective compliant related to his sleep apnea and snoring, the treatment will be more directed towards eliminating the snoring related sleep disturbance to his wife.  1. CPAP is the number one option: it will address apnea and snoring, but requires good nasal airway patency.  2. A dental device may be enough to reduce mild apnea without hypoxemia and will treat snoring if a CPAP is not appealing.   3. Improvement of nasal patency - better airflow through the nose- will help to reduce snoring, as can weight loss and avoiding supine sleep. It will not completely alleviate the condition. I certify that I have reviewed the raw data recording prior to the issuance of this report in accordance with the standards of the American Academy of Sleep Medicine (AASM). Larey Seat, M.D.  11-20-2017   Medical Director of [facility] at [facility], accredited by the AASM. Diplomat of the ABPN and ABSM.     Communications    [facility] Provider CC Chart Rep sent to Crist Infante, MD  Sent 11/20/2017

## 2019-02-09 NOTE — Telephone Encounter (Signed)
-----   Message from Larey Seat, MD sent at 02/09/2019 12:30 PM EST ----- Summary & Diagnosis:   We found Severe Sleep Apnea (OSA) at AHI of 50.8/h and REM sleep  AHI was 78.7/h. Non positional apnea associated with loud  snoring but not with hypoxia. Trend to bradycardia was noted.   Recommendations:    This HST result is very different from [place] in Winter 2019. Such  severe apnea with REM dependence has developed, that CPAP is now  the only treatment option.  BMI was 32.3 kg/m2 at this time and was 29.3 kg/m2 in 2019- I  find this AHI variation very unexpected and questionable - If  patient is willing to further investigate the difference, I will  invite him to an attended CPAP study with 1 hour baseline, to  confirm that HST results are correct.  If we cannot find insurance authorization , I will order an  autotitration device instead and begin therapy at 5-18 cm water  pressure, 2 cm EPR, heated humidity, and mask of patient's  choice.     Interpreting Physician: Larey Seat, MD

## 2019-02-09 NOTE — Telephone Encounter (Signed)
There is a great discrepancy between the 2 HSTests and they were only 14 month apart, 3 BMI units apart.  An explanation is that the first study was by apnea link and is less exact.   I would much prefer an attended CPAP titration but already learned through Shirle[facility] Myl[facility] that it will not be covered. Therefor auto CPA must be ordered, order placed today.

## 2019-02-09 NOTE — Telephone Encounter (Signed)
Called patient to discuss sleep study results. No answer at this time. LVM for the patient to call back.   

## 2019-02-24 DIAGNOSIS — G4733 Obstructive sleep apnea (adult) (pediatric): Secondary | ICD-10-CM | POA: Diagnosis not present

## 2019-04-13 NOTE — Progress Notes (Signed)
PATIENT: Cole Jones DOB: May 09, 1970  REASON FOR VISIT: follow up HISTORY FROM: patient  Chief Complaint  Patient presents with  . Follow-up    back rm, alone, CPAP fu, pt states he is doing well     HISTORY OF PRESENT ILLNESS: Today 04/14/19 Cole Jones is a 49 y.o. male here today for follow up for OSA recently started on CPAP therapy. HST in 01/2019 "found Severe Sleep Apnea (OSA) at AHI of 50.8/h and REM sleep AHI was 78.7/h. Non positional apnea associated with loud snoring but not with hypoxia". This was significantly different than HST in 2019 where mild apnea was diagnosed. BMI had increased 3 points. Titration study preferred but not covered by insurance. Autopap was ordered. He has done very well on therapy. Snoring has stopped. His wife reports that she is sleeping much better. He does feel better rested in the mornings. He denies any difficulty with therapy.   Compliance report dated 03/14/2019 through 04/12/2019 reveals that he has used CPAP 30 of the past 30 days for compliance of 100%.  He used CPAP greater than 4 hours all 30 days for compliance of 100%.  Average usage was 7 hours and 16 minutes.  Residual AHI was 1.5 on 5 to 18 cm of water and an EPR of 2.  There was no significant leak noted.     REVIEW OF SYSTEMS: Out of a complete 14 system review of symptoms, the patient complains only of the following symptoms, none and all other reviewed systems are negative.  ESS: 3  ALLERGIES: No Known Allergies  HOME MEDICATIONS: Outpatient Medications Prior to Visit  Medication Sig Dispense Refill  . Cholecalciferol (VITAMIN D) 2000 UNITS CAPS Take 1 capsule by mouth daily.    Marland Kitchen FIBER PO Take by mouth daily.      . fluticasone (FLONASE) 50 MCG/ACT nasal spray Place 2 sprays into both nostrils daily as needed.     . Multiple Vitamin (MULTIVITAMIN) tablet Take 1 tablet by mouth daily.    Marland Kitchen omeprazole (PRILOSEC) 20 MG capsule TK 1 C PO QD 10 TO 20 MIN AC    .  Zolpidem Tartrate (AMBIEN PO) Take by mouth. States he has not been taking the [place], but has it just incase.    . diclofenac Sodium (VOLTAREN) 1 % GEL Apply 4 g topically 4 (four) times daily. To affected joint. (Patient not taking: Reported on 01/13/2019) 100 g 11  . traMADol (ULTRAM) 50 MG tablet Take 1 tablet (50 mg total) by mouth every 8 (eight) hours as needed for severe pain. (Patient not taking: Reported on 01/13/2019) 7 tablet 0   No facility-administered medications prior to visit.    PAST MEDICAL HISTORY: Past Medical History:  Diagnosis Date  . Anal fissure    Hx of   . GERD (gastroesophageal reflux disease)     PAST SURGICAL HISTORY: Past Surgical History:  Procedure Laterality Date  . HAND SURGERY Left   . HERNIA REPAIR      FAMILY HISTORY: Family History  Problem Relation Age of Onset  . Osteopenia Mother   . Prostate cancer Father   . Breast cancer Maternal Grandmother   . Dementia Paternal Grandmother   . Heart attack Paternal Grandfather   . Colon cancer Neg Hx     SOCIAL HISTORY: Social History   Socioeconomic History  . Marital status: Married    Spouse name: Not on file  . Number of children: 1  . Years  of education: Not on file  . Highest education level: Not on file  Occupational History  . Occupation: Attorney   Tobacco Use  . Smoking status: Former Research scientist (life sciences)  . Smokeless tobacco: Never Used  Substance and Sexual Activity  . Alcohol use: Yes    Comment: 3-4 a week   . Drug use: No  . Sexual activity: Not on file  Other Topics Concern  . Not on file  Social History Narrative  . Not on file   Social Determinants of Health   Financial Resource Strain:   . Difficulty of Paying Living Expenses:   Food Insecurity:   . Worried About Charity fundraiser in the Last Year:   . Arboriculturist in the Last Year:   Transportation Needs:   . Film/video editor (Medical):   Marland Kitchen Lack of Transportation (Non-Medical):   Physical Activity:   .  Days of Exercise per Week:   . Minutes of Exercise per Session:   Stress:   . Feeling of Stress :   Social Connections:   . Frequency of Communication with Friends and Family:   . Frequency of Social Gatherings with Friends and Family:   . Attends Religious Services:   . Active Member of Clubs or Organizations:   . Attends Archivist Meetings:   Marland Kitchen Marital Status:   Intimate Partner Violence:   . Fear of Current or Ex-Partner:   . Emotionally Abused:   Marland Kitchen Physically Abused:   . Sexually Abused:       PHYSICAL EXAM  Vitals:   04/14/19 0858  BP: 133/86  Pulse: (!) 57  Temp: 97.6 F (36.4 C)  Weight: 210 lb (95.3 kg)  Height: 5\' 7"  (1.702 m)   Body mass index is 32.89 kg/m.  Generalized: Well developed, in no acute distress  Cardiology: normal rate and rhythm, no murmur noted Respiratory: clear to auscultation bilaterally  Neurological examination  Mentation: Alert oriented to time, place, history taking. Follows all commands speech and language fluent Cranial nerve II-XII: Pupils were equal round reactive to light. Extraocular movements were full, visual field were full  Motor: The motor testing reveals 5 over 5 strength of all 4 extremities. Good symmetric motor tone is noted throughout.   Gait and station: Gait is normal.   DIAGNOSTIC DATA (LABS, IMAGING, TESTING) - I reviewed patient records, labs, notes, testing and imaging myself where available.  No flowsheet data found.   No results found for: WBC, HGB, HCT, MCV, PLT No results found for: NA, K, CL, CO2, GLUCOSE, BUN, CREATININE, CALCIUM, PROT, ALBUMIN, AST, ALT, ALKPHOS, BILITOT, GFRNONAA, GFRAA No results found for: CHOL, HDL, LDLCALC, LDLDIRECT, TRIG, CHOLHDL No results found for: HGBA1C No results found for: VITAMINB12 No results found for: TSH     ASSESSMENT AND PLAN 49 y.o. year old male  has a past medical history of Anal fissure and GERD (gastroesophageal reflux disease). here with      ICD-10-CM   1. OSA on CPAP  G47.33    Z99.89     [name] is doing very well on CPAP therapy.  Compliance report reveals excellent compliance.  He was encouraged to continue using CPAP nightly and for greater than 4 hours each night.  He will continue to focus on healthy lifestyle habits with well-balanced diet and regular exercise.  He will follow up with me in 1 year, sooner if needed.  He verbalizes understanding and agreement with this plan.   No  orders of the defined types were placed in this encounter.    No orders of the defined types were placed in this encounter.     I spent 15 minutes with the patient. 50% of this time was spent counseling and educating patient on plan of care and medications.    Debbora Presto, FNP-C 04/14/2019, 9:00 AM [facility] [address] [place], [place] [address] [phone]

## 2019-04-14 ENCOUNTER — Ambulatory Visit: Payer: Federal, State, Local not specified - PPO | Admitting: Family Medicine

## 2019-04-14 ENCOUNTER — Other Ambulatory Visit: Payer: Self-pay

## 2019-04-14 ENCOUNTER — Encounter: Payer: Self-pay | Admitting: Family Medicine

## 2019-04-14 VITALS — BP 133/86 | HR 57 | Temp 97.6°F | Ht 67.0 in | Wt 210.0 lb

## 2019-04-14 DIAGNOSIS — Z9989 Dependence on other enabling machines and devices: Secondary | ICD-10-CM

## 2019-04-14 DIAGNOSIS — G4733 Obstructive sleep apnea (adult) (pediatric): Secondary | ICD-10-CM | POA: Diagnosis not present

## 2019-04-14 NOTE — Patient Instructions (Signed)
Please continue using your CPAP regularly. While your insurance requires that you use CPAP at least 4 hours each night on 70% of the nights, I recommend, that you not skip any nights and use it throughout the night if you can. Getting used to CPAP and staying with the treatment long term does take time and patience and discipline. Untreated obstructive sleep apnea when it is moderate to severe can have an adverse impact on cardiovascular health and raise her risk for heart disease, arrhythmias, hypertension, congestive heart failure, stroke and diabetes. Untreated obstructive sleep apnea causes sleep disruption, nonrestorative sleep, and sleep deprivation. This can have an impact on your day to day functioning and cause daytime sleepiness and impairment of cognitive function, memory loss, mood disturbance, and problems focussing. Using CPAP regularly can improve these symptoms.   Follow up in 1 year, sooner if needed   Sleep Apnea Sleep apnea affects breathing during sleep. It causes breathing to stop for a short time or to become shallow. It can also increase the risk of:  Heart attack.  Stroke.  Being very overweight (obese).  Diabetes.  Heart failure.  Irregular heartbeat. The goal of treatment is to help you breathe normally again. What are the causes? There are three kinds of sleep apnea:  Obstructive sleep apnea. This is caused by a blocked or collapsed airway.  Central sleep apnea. This happens when the brain does not send the right signals to the muscles that control breathing.  Mixed sleep apnea. This is a combination of obstructive and central sleep apnea. The most common cause of this condition is a collapsed or blocked airway. This can happen if:  Your throat muscles are too relaxed.  Your tongue and tonsils are too large.  You are overweight.  Your airway is too small. What increases the risk?  Being overweight.  Smoking.  Having a small airway.  Being  older.  Being male.  Drinking alcohol.  Taking medicines to calm yourself (sedatives or tranquilizers).  Having family members with the condition. What are the signs or symptoms?  Trouble staying asleep.  Being sleepy or tired during the day.  Getting angry a lot.  Loud snoring.  Headaches in the morning.  Not being able to focus your mind (concentrate).  Forgetting things.  Less interest in sex.  Mood swings.  Personality changes.  Feelings of sadness (depression).  Waking up a lot during the night to pee (urinate).  Dry mouth.  Sore throat. How is this diagnosed?  Your medical history.  A physical exam.  A test that is done when you are sleeping (sleep study). The test is most often done in a sleep lab but may also be done at home. How is this treated?   Sleeping on your side.  Using a medicine to get rid of mucus in your nose (decongestant).  Avoiding the use of alcohol, medicines to help you relax, or certain pain medicines (narcotics).  Losing weight, if needed.  Changing your diet.  Not smoking.  Using a machine to open your airway while you sleep, such as: ? An oral appliance. This is a mouthpiece that shifts your lower jaw forward. ? A CPAP device. This device blows air through a mask when you breathe out (exhale). ? An EPAP device. This has valves that you put in each nostril. ? A BPAP device. This device blows air through a mask when you breathe in (inhale) and breathe out.  Having surgery if other treatments do not   work. It is important to get treatment for sleep apnea. Without treatment, it can lead to:  High blood pressure.  Coronary artery disease.  In men, not being able to have an erection (impotence).  Reduced thinking ability. Follow these instructions at home: Lifestyle  Make changes that your doctor recommends.  Eat a healthy diet.  Lose weight if needed.  Avoid alcohol, medicines to help you relax, and some pain  medicines.  Do not use any products that contain nicotine or tobacco, such as cigarettes, e-cigarettes, and chewing tobacco. If you need help quitting, ask your doctor. General instructions  Take over-the-counter and prescription medicines only as told by your doctor.  If you were given a machine to use while you sleep, use it only as told by your doctor.  If you are having surgery, make sure to tell your doctor you have sleep apnea. You may need to bring your device with you.  Keep all follow-up visits as told by your doctor. This is important. Contact a doctor if:  The machine that you were given to use during sleep bothers you or does not seem to be working.  You do not get better.  You get worse. Get help right away if:  Your chest hurts.  You have trouble breathing in enough air.  You have an uncomfortable feeling in your back, arms, or stomach.  You have trouble talking.  One side of your body feels weak.  A part of your face is hanging down. These symptoms may be an emergency. Do not wait to see if the symptoms will go away. Get medical help right away. Call your local emergency services (911 in the [place] Do not drive yourself to the hospital. Summary  This condition affects breathing during sleep.  The most common cause is a collapsed or blocked airway.  The goal of treatment is to help you breathe normally while you sleep. This information is not intended to replace advice given to you by your health care provider. Make sure you discuss any questions you have with your health care provider. Document Revised: 10/10/2017 Document Reviewed: 08/19/2017 Elsevier Patient Education  2020 Elsevier Inc.  

## 2019-04-15 NOTE — Progress Notes (Signed)
community message has been sent to [employer]

## 2019-04-24 DIAGNOSIS — G4733 Obstructive sleep apnea (adult) (pediatric): Secondary | ICD-10-CM | POA: Diagnosis not present

## 2019-05-24 DIAGNOSIS — G4733 Obstructive sleep apnea (adult) (pediatric): Secondary | ICD-10-CM | POA: Diagnosis not present

## 2019-06-17 ENCOUNTER — Ambulatory Visit (INDEPENDENT_AMBULATORY_CARE_PROVIDER_SITE_OTHER): Payer: Federal, State, Local not specified - PPO | Admitting: Family Medicine

## 2019-06-17 ENCOUNTER — Other Ambulatory Visit: Payer: Self-pay

## 2019-06-17 ENCOUNTER — Encounter: Payer: Self-pay | Admitting: Family Medicine

## 2019-06-17 ENCOUNTER — Ambulatory Visit (INDEPENDENT_AMBULATORY_CARE_PROVIDER_SITE_OTHER): Payer: Federal, State, Local not specified - PPO

## 2019-06-17 VITALS — BP 130/90 | HR 60 | Ht 67.0 in | Wt 214.0 lb

## 2019-06-17 DIAGNOSIS — M79661 Pain in right lower leg: Secondary | ICD-10-CM | POA: Diagnosis not present

## 2019-06-17 NOTE — Patient Instructions (Signed)
Thank you for coming in today. I think you have a calf strain.   I recommend you obtained a compression sleeve to help with your joint problems. There are many options on the market however I recommend obtaining a  Full calf Body Helix compression sleeve.  You can find information (including how to appropriate measure yourself for sizing) can be found at www.Body http://www.lambert.com/.  Many of these products are health savings account (HSA) eligible.   You can use the compression sleeve at any time throughout the day but is most important to use while being active as well as for 2 hours post-activity.   It is appropriate to ice following activity with the compression sleeve in place.   Ok to also use long cam walker boot as needed.   Exercises do the heel up to down slowly.  Do about 10-30 reps 2-3x daily.   Use voltaren gel for now.  If not improving let me know and I will prescribe nitropatches.   Nitroglycerin Protocol   Apply 1/4 nitroglycerin patch to affected area daily.  Change position of patch within the affected area every 24 hours.  You may experience a headache during the first 1-2 weeks of using the patch, these should subside.  If you experience headaches after beginning nitroglycerin patch treatment, you may take your preferred over the counter pain reliever.  Another side effect of the nitroglycerin patch is skin irritation or rash related to patch adhesive.  Please notify our office if you develop more severe headaches or rash, and stop the patch.  Tendon healing with nitroglycerin patch may require 12 to 24 weeks depending on the extent of injury.  Men should not use if taking Viagra, Cialis, or Levitra.   Do not use if you have migraines or rosacea.     Medial Head Gastrocnemius Tear Rehab Ask your health care provider which exercises are safe for you. Do exercises exactly as told by your health care provider and adjust them as directed. It is normal to feel mild  stretching, pulling, tightness, or discomfort as you do these exercises. Stop right away if you feel sudden pain or your pain gets worse. Do not begin these exercises until told by your health care provider. Stretching and range-of-motion exercises These exercises warm up your muscles and joints and improve the movement and flexibility of your lower leg. These exercises also help to relieve pain and stiffness. Gastrocnemius stretch This exercise is also called a calf stretch. It stretches the muscles in the back of the lower leg (gastrocnemius). 1. Sit with your left / right leg extended. 2. Loop a belt or towel around the ball of your left / right foot. The ball of your foot is on the walking surface, right under your toes. 3. Hold both ends of the belt or towel. 4. Keep your left / right ankle and foot relaxed and keep your knee straight while you use the belt or towel to pull your foot and ankle toward you. Stop at the first point of resistance. 5. Hold this position for __________ seconds. Repeat __________ times. Complete this exercise __________ times a day. Ankle alphabet  1. Sit with your left / right leg supported at the lower leg. ? Do not rest your foot on anything. ? Make sure your foot has room to move freely. 2. Think of your left / right foot as a paintbrush. ? Move your foot to trace each letter of the alphabet in the air. Keep your  hip and knee still while you trace. ? Make the letters as large as you can without feeling discomfort. 3. Trace every letter of the alphabet. Repeat __________ times. Complete this exercise __________ times a day. Strengthening exercises These exercises build strength and endurance in your lower leg. Endurance is the ability to use your muscles for a long time, even after they get tired. Plantar flexion with band, seated  1. Sit on the floor with your left / right leg extended. 2. Loop a rubber exercise band or tube around the ball of your left /  right foot. The ball of your foot is on the walking surface, right under your toes. The band or tube should be slightly tense when your foot is relaxed. If the band or tube slips, you can put on your shoe or put a washcloth between the band and your foot to help it stay in place. 3. While holding both ends of the band or tube, slowly point your toes downward, pushing them away from you (plantar flexion). 4. Hold this position for __________ seconds. 5. Slowly release the tension in the band or tube, controlling smoothly until your foot is back to the starting position. Repeat __________ times. Complete this exercise __________ times a day. Plantar flexion, standing  1. Stand with your feet shoulder-width apart. 2. Place your hands on a wall or table to steady yourself as needed, but try not to use it for support. 3. Rise up on your toes (plantar flexion). 4. If this exercise is too easy, try these options: ? Shift your weight toward your left / right leg until you feel challenged. ? If told by your health care provider, stand on your left / right foot only. 5. Hold this position for __________ seconds. Repeat __________ times. Complete this exercise __________ times a day. Eccentric plantar flexion  1. Stand on the balls of your feet on the edge of a step. The ball of your foot is on the walking surface, right under your toes. ? Do not put your heels on the step. ? For balance, rest your hands on the wall or on a railing. Try not to lean on it for support. 2. Rise up onto the balls of your feet, using both legs to help. 3. Keeping your heels up, slowly shift all of your weight to your left / right foot and lift your other foot off the step. 4. Slowly lower your left / right heel so it drops below the level of the step. Lowering your heel under tension is called eccentric plantar flexion. You will feel a slight stretch in your left / right calf. 5. Put your other foot back onto the step before  returning to the start position. Repeat __________ times. Complete this exercise __________ times a day. This information is not intended to replace advice given to you by your health care provider. Make sure you discuss any questions you have with your health care provider. Document Revised: 10/21/2018 Document Reviewed: 11/03/2017 Elsevier Patient Education  2020 [employer].

## 2019-06-17 NOTE — Progress Notes (Signed)
   Cole Jones, am serving as a Education administrator for Dr. Lynne Leader.  Cole Jones is a 49 y.o. male who presents to [place] at [facility] today for R calf pain. Patient was last seen by Dr. Georgina Snell on 12/01/2018 for left knee pain. Patient states that he heard a pop yesterday while playing tennis jumped up for a high ball. Locates pain to Mid R calf and feels like throbbing, but sharp shooting when walking. Rates pain 3/10 last night 8/10. Cannot bear weight taking advil and icy hot, and ice.   Pertinent review of systems: No fevers or chills  Relevant historical information: Sleep apnea   Exam:  BP 130/90 (BP Location: Left Arm, Patient Position: Sitting, Cuff Size: Normal)   Pulse 60   Ht 5\' 7"  (1.702 m)   Wt 214 lb (97.1 kg)   SpO2 98%   BMI 33.52 kg/m  General: Well Developed, well nourished, and in no acute distress.   MSK: Right calf normal-appearing Normal motion. Tender palpation medial gastrocnemius.  No palpable defects gastrocnemius or Achilles tendon. Strength slightly reduced to plantar flexion ankle due to pain. Antalgic gait.    Lab and Radiology Results  Diagnostic Limited MSK Ultrasound of: Right calf and Achilles tendon Achilles tendon intact normal-appearing from insertion onto the calcaneus to origin at gastrocnemius. Medial gastrocnemius area of tenderness visualized with hypoechoic fluid tracking superficial to musculotendinous junction consistent with partial tear.  No large defect visible. Impression: Medial gastrocnemius strain     Assessment and Plan: 49 y.o. male with medial gastrocnemius strain versus small tear.  Plan for compression sleeve eccentric exercises and Voltaren gel.  Provided information for nitroglycerin patch protocol which can use if needed in the future.  Additionally discussed possibility of cam walker boot.  Probably not necessary at this point.  However patient will be leaving on the 20th to go to [facility] for a week.  He may want to have a cam walker boot for traversing the [place].  Recheck 1 month if not better    Orders Placed This Encounter  Procedures  . [place] LIMITED JOINT SPACE STRUCTURES LOW RIGHT    Standing Status:   Future    Number of Occurrences:   1    Standing Expiration Date:   06/16/2020    Order Specific Question:   Reason for Exam (SYMPTOM  OR DIAGNOSIS REQUIRED)    Answer:   R calf pain    Order Specific Question:   Preferred imaging location?    Answer:   [place]   No orders of the defined types were placed in this encounter.    Discussed warning signs or symptoms. Please see discharge instructions. Patient expresses understanding.   The above documentation has been reviewed and is accurate and complete Lynne Leader, M.D.

## 2019-06-24 DIAGNOSIS — G4733 Obstructive sleep apnea (adult) (pediatric): Secondary | ICD-10-CM | POA: Diagnosis not present

## 2019-07-14 DIAGNOSIS — G4733 Obstructive sleep apnea (adult) (pediatric): Secondary | ICD-10-CM | POA: Diagnosis not present

## 2019-07-24 DIAGNOSIS — G4733 Obstructive sleep apnea (adult) (pediatric): Secondary | ICD-10-CM | POA: Diagnosis not present

## 2019-07-28 DIAGNOSIS — H5789 Other specified disorders of eye and adnexa: Secondary | ICD-10-CM | POA: Diagnosis not present

## 2019-07-28 DIAGNOSIS — H1033 Unspecified acute conjunctivitis, bilateral: Secondary | ICD-10-CM | POA: Diagnosis not present

## 2019-08-24 DIAGNOSIS — G4733 Obstructive sleep apnea (adult) (pediatric): Secondary | ICD-10-CM | POA: Diagnosis not present

## 2019-09-24 DIAGNOSIS — G4733 Obstructive sleep apnea (adult) (pediatric): Secondary | ICD-10-CM | POA: Diagnosis not present

## 2019-10-16 DIAGNOSIS — Z23 Encounter for immunization: Secondary | ICD-10-CM | POA: Diagnosis not present

## 2019-10-20 ENCOUNTER — Other Ambulatory Visit: Payer: Self-pay | Admitting: Internal Medicine

## 2019-10-20 DIAGNOSIS — E785 Hyperlipidemia, unspecified: Secondary | ICD-10-CM

## 2019-10-24 DIAGNOSIS — G4733 Obstructive sleep apnea (adult) (pediatric): Secondary | ICD-10-CM | POA: Diagnosis not present

## 2019-10-26 DIAGNOSIS — Z0000 Encounter for general adult medical examination without abnormal findings: Secondary | ICD-10-CM | POA: Diagnosis not present

## 2019-10-26 DIAGNOSIS — E785 Hyperlipidemia, unspecified: Secondary | ICD-10-CM | POA: Diagnosis not present

## 2019-10-26 DIAGNOSIS — Z125 Encounter for screening for malignant neoplasm of prostate: Secondary | ICD-10-CM | POA: Diagnosis not present

## 2019-10-28 DIAGNOSIS — E785 Hyperlipidemia, unspecified: Secondary | ICD-10-CM | POA: Diagnosis not present

## 2019-10-28 DIAGNOSIS — Z0000 Encounter for general adult medical examination without abnormal findings: Secondary | ICD-10-CM | POA: Diagnosis not present

## 2019-10-28 DIAGNOSIS — R82998 Other abnormal findings in urine: Secondary | ICD-10-CM | POA: Diagnosis not present

## 2019-11-05 ENCOUNTER — Ambulatory Visit
Admission: RE | Admit: 2019-11-05 | Discharge: 2019-11-05 | Disposition: A | Discharge status: Home / Self Care | Payer: Self-pay | Source: Ambulatory Visit | Attending: Internal Medicine | Admitting: Internal Medicine

## 2019-11-05 DIAGNOSIS — E785 Hyperlipidemia, unspecified: Secondary | ICD-10-CM

## 2019-11-24 DIAGNOSIS — G4733 Obstructive sleep apnea (adult) (pediatric): Secondary | ICD-10-CM | POA: Diagnosis not present

## 2019-12-01 ENCOUNTER — Encounter: Payer: Self-pay | Admitting: Gastroenterology

## 2019-12-22 DIAGNOSIS — M25522 Pain in left elbow: Secondary | ICD-10-CM | POA: Diagnosis not present

## 2019-12-22 DIAGNOSIS — M7022 Olecranon bursitis, left elbow: Secondary | ICD-10-CM | POA: Diagnosis not present

## 2019-12-23 ENCOUNTER — Other Ambulatory Visit: Payer: Self-pay

## 2019-12-23 ENCOUNTER — Ambulatory Visit (AMBULATORY_SURGERY_CENTER): Payer: Self-pay

## 2019-12-23 VITALS — Ht 69.0 in | Wt 219.0 lb

## 2019-12-23 DIAGNOSIS — Z1211 Encounter for screening for malignant neoplasm of colon: Secondary | ICD-10-CM

## 2019-12-23 MED ORDER — NA SULFATE-K SULFATE-MG SULF 17.5-3.13-1.6 GM/177ML PO SOLN: 1.0000 | Freq: Once | ORAL | 0 refills | Status: AC

## 2019-12-23 NOTE — Progress Notes (Signed)
No egg or soy allergy known to patient  No issues with past sedation with any surgeries or procedures No intubation problems in the past  No FH of Malignant Hyperthermia No diet pills per patient No home 02 use per patient  No blood thinners per patient  Pt denies issues with constipation  No A fib or A flutter  EMMI video to pt or via [place] 19 guidelines implemented in PV today with Pt and RN  Pt is fully vaccinated  for Covid + booster Coupon given to pt in PV today , Code to Pharmacy  Due to the COVID-19 pandemic we are asking patients to follow certain guidelines.  Pt aware of COVID protocols and [facility] guidelines

## 2020-01-10 ENCOUNTER — Telehealth: Payer: Self-pay | Admitting: Gastroenterology

## 2020-01-10 NOTE — Telephone Encounter (Signed)
Inbound call from patient stating he has a routine dental appointment scheduled the day before his procedure on 01/13/20.  Wants to make sure he can keep this appointment since he knows he is not suppose to have anything red or purple or does he need to reschedule.  Please advise.

## 2020-01-10 NOTE — Telephone Encounter (Signed)
Spoke with patient, advised that he is able to keep his procedure as scheduled. Advised that he is not to eat or drink anything red or purple. Patient had concerns that if he had some mild gum bleeding from the cleaning that would be an issue. Advised that it is fine and he can proceed with appointment as scheduled. Patient denied that he was having any oral surgery or anything invasive. Patient verbalized understanding of all information and had no other concerns.

## 2020-01-13 ENCOUNTER — Ambulatory Visit (AMBULATORY_SURGERY_CENTER): Payer: Federal, State, Local not specified - PPO | Admitting: Gastroenterology

## 2020-01-13 ENCOUNTER — Encounter: Payer: Self-pay | Admitting: Gastroenterology

## 2020-01-13 ENCOUNTER — Other Ambulatory Visit: Payer: Self-pay

## 2020-01-13 VITALS — BP 125/81 | HR 54 | Temp 97.8°F | Resp 12 | Ht 69.0 in | Wt 219.0 lb

## 2020-01-13 DIAGNOSIS — Z1211 Encounter for screening for malignant neoplasm of colon: Secondary | ICD-10-CM

## 2020-01-13 DIAGNOSIS — D129 Benign neoplasm of anus and anal canal: Secondary | ICD-10-CM

## 2020-01-13 DIAGNOSIS — D128 Benign neoplasm of rectum: Secondary | ICD-10-CM | POA: Diagnosis not present

## 2020-01-13 HISTORY — PX: COLONOSCOPY: SHX174

## 2020-01-13 MED ORDER — SODIUM CHLORIDE 0.9 % IV SOLN: 500.0000 mL | Freq: Once | INTRAVENOUS | Status: DC

## 2020-01-13 NOTE — Progress Notes (Signed)
Called to room to assist during endoscopic procedure.  Patient ID and intended procedure confirmed with present staff. Received instructions for my participation in the procedure from the performing physician.  

## 2020-01-13 NOTE — Patient Instructions (Addendum)
Handouts were given to you on polyps, diverticulosis, and hemorrhoids. NO ASPIRIN, ASPIRIN CONTAINING PRODUCTS (BC OR GOODY POWDERS) OR NSAIDS (IBUPROFEN, ADVIL, ALEVE, AND MOTRIN) FOR 2 weeks; TYLENOL IS OK TO TAKE if needed. You may resume your current medications today. Await biopsy results.  May take 2-3 weeks to receive pathology results. Please call if any questions or concerns.    YOU HAD AN ENDOSCOPIC PROCEDURE TODAY AT THE [facility]:   Refer to the procedure report that was given to you for any specific questions about what was found during the examination.  If the procedure report does not answer your questions, please call your gastroenterologist to clarify.  If you requested that your care partner not be given the details of your procedure findings, then the procedure report has been included in a sealed envelope for you to review at your convenience later.  YOU SHOULD EXPECT: Some feelings of bloating in the abdomen. Passage of more gas than usual.  Walking can help get rid of the air that was put into your GI tract during the procedure and reduce the bloating. If you had a lower endoscopy (such as a colonoscopy or flexible sigmoidoscopy) you may notice spotting of blood in your stool or on the toilet paper. If you underwent a bowel prep for your procedure, you may not have a normal bowel movement for a few days.  Please Note:  You might notice some irritation and congestion in your nose or some drainage.  This is from the oxygen used during your procedure.  There is no need for concern and it should clear up in a day or so.  SYMPTOMS TO REPORT IMMEDIATELY:   Following lower endoscopy (colonoscopy or flexible sigmoidoscopy):  Excessive amounts of blood in the stool  Significant tenderness or worsening of abdominal pains  Swelling of the abdomen that is new, acute  Fever of 100F or higher     For urgent or emergent issues, a gastroenterologist can be reached at  any hour by calling [phone]. Do not use MyChart messaging for urgent concerns.    DIET:  We do recommend a small meal at first, but then you may proceed to your regular diet.  Drink plenty of fluids but you should avoid alcoholic beverages for 24 hours.  ACTIVITY:  You should plan to take it easy for the rest of today and you should NOT DRIVE or use heavy machinery until tomorrow (because of the sedation medicines used during the test).    FOLLOW UP: Our staff will call the number listed on your records 48-72 hours following your procedure to check on you and address any questions or concerns that you may have regarding the information given to you following your procedure. If we do not reach you, we will leave a message.  We will attempt to reach you two times.  During this call, we will ask if you have developed any symptoms of COVID 19. If you develop any symptoms (ie: fever, flu-like symptoms, shortness of breath, cough etc.) before then, please call [phone].  If you test positive for Covid 19 in the 2 weeks post procedure, please call and report this information to [place].    If any biopsies were taken you will be contacted by phone or by letter within the next 1-3 weeks.  Please call us at [phone] if you have not heard about the biopsies in 3 weeks.    SIGNATURES/CONFIDENTIALITY: You and/or your care partner have signed  paperwork which will be entered into your electronic medical record.  These signatures attest to the fact that that the information above on your After Visit Summary has been reviewed and is understood.  Full responsibility of the confidentiality of this discharge information lies with you and/or your care-partner.

## 2020-01-13 NOTE — Op Note (Addendum)
[place] Patient Name: Cole Jones Procedure Date: 01/13/2020 2:34 PM MRN: [id number] Endoscopist: Remo Lipps P. Havery Moros , MD Age: 50 Referring MD:  Date of Birth: 18-Jun-1970 Gender: Male Account #: [id number] Procedure:                Colonoscopy Indications:              Screening for colorectal malignant neoplasm, This                            is the patient's first colonoscopy Medicines:                Monitored Anesthesia Care Procedure:                Pre-Anesthesia Assessment:                           - Prior to the procedure, a History and Physical                            was performed, and patient medications and                            allergies were reviewed. The patient's tolerance of                            previous anesthesia was also reviewed. The risks                            and benefits of the procedure and the sedation                            options and risks were discussed with the patient.                            All questions were answered, and informed consent                            was obtained. Prior Anticoagulants: The patient has                            taken no previous anticoagulant or antiplatelet                            agents. ASA Grade Assessment: II - A patient with                            mild systemic disease. After reviewing the risks                            and benefits, the patient was deemed in                            satisfactory condition to undergo the procedure.  After obtaining informed consent, the colonoscope                            was passed under direct vision. Throughout the                            procedure, the patient's blood pressure, pulse, and                            oxygen saturations were monitored continuously. The                            Olympus CF-HQ190 [id number]) Colonoscope was                            introduced through the  anus and advanced to the the                            cecum, identified by appendiceal orifice and                            ileocecal valve. The colonoscopy was performed                            without difficulty. The patient tolerated the                            procedure well. The quality of the bowel                            preparation was good. The ileocecal valve,                            appendiceal orifice, and rectum were photographed. Scope In: 2:46:28 PM Scope Out: 3:03:18 PM Scope Withdrawal Time: 0 hours 12 minutes 16 seconds  Total Procedure Duration: 0 hours 16 minutes 50 seconds  Findings:                 Skin tags were found on perianal exam.                           A few small-mouthed diverticula were found in the                            sigmoid colon.                           A 10 to 15 mm polyp was found in the rectum. The                            polyp was semi-pedunculated. The polyp was removed                            with a hot snare. Resection and retrieval were  complete.                           Internal hemorrhoids were found during                            retroflexion. The hemorrhoids were small.                           The exam was otherwise without abnormality. Complications:            No immediate complications. Estimated blood loss:                            Minimal. Estimated Blood Loss:     Estimated blood loss was minimal. Impression:               - Perianal skin tags found on perianal exam.                           - Diverticulosis in the sigmoid colon.                           - One 10 to 15 mm polyp in the rectum, removed with                            a hot snare. Resected and retrieved.                           - Internal hemorrhoids.                           - The examination was otherwise normal. Recommendation:           - Patient has a contact number available for                             emergencies. The signs and symptoms of potential                            delayed complications were discussed with the                            patient. Return to normal activities tomorrow.                            Written discharge instructions were provided to the                            patient.                           - Resume previous diet.                           - Continue present medications.                           -  Await pathology results.                           - No ibuprofen, naproxen, or other non-steroidal                            anti-inflammatory drugs for 2 weeks after polyp                            removal. Remo Lipps P. Marquelle Musgrave, MD 01/13/2020 3:07:40 PM This report has been signed electronically.

## 2020-01-13 NOTE — Progress Notes (Signed)
Pt's states no medical or surgical changes since previsit or office visit. ° ° °Vitals BC °

## 2020-01-13 NOTE — Progress Notes (Signed)
No problems noted in the recovery room. maw 

## 2020-01-17 ENCOUNTER — Telehealth: Payer: Self-pay | Admitting: *Deleted

## 2020-01-17 NOTE — Telephone Encounter (Signed)
  Follow up Call-  Call back number 01/13/2020  Post procedure Call Back phone  # [phone]  Permission to leave phone message Yes  Some recent data might be hidden     Patient questions:  Do you have a fever, pain , or abdominal swelling? No. Pain Score  0 *  Have you tolerated food without any problems? Yes.    Have you been able to return to your normal activities? Yes.    Do you have any questions about your discharge instructions: Diet   No. Medications  No. Follow up visit  No.  Do you have questions or concerns about your Care? No.  Actions: * If pain score is 4 or above: No action needed, pain <4.  1. Have you developed a fever since your procedure? no  2.   Have you had an respiratory symptoms (SOB or cough) since your procedure? no  3.   Have you tested positive for COVID 19 since your procedure no  4.   Have you had any family members/close contacts diagnosed with the COVID 19 since your procedure?  no   If yes to any of these questions please route to Joylene John, RN and Joella Prince, RN

## 2020-01-17 NOTE — Telephone Encounter (Signed)
  Follow up Call-  Call back number 01/13/2020  Post procedure Call Back phone  # [phone]  Permission to leave phone message Yes  Some recent data might be hidden     First attempt for follow up phone call. No answer at number given.  Left message on voicemail.

## 2020-01-26 DIAGNOSIS — L570 Actinic keratosis: Secondary | ICD-10-CM | POA: Diagnosis not present

## 2020-01-26 DIAGNOSIS — L308 Other specified dermatitis: Secondary | ICD-10-CM | POA: Diagnosis not present

## 2020-02-17 DIAGNOSIS — G4733 Obstructive sleep apnea (adult) (pediatric): Secondary | ICD-10-CM | POA: Diagnosis not present

## 2020-03-21 DIAGNOSIS — G4733 Obstructive sleep apnea (adult) (pediatric): Secondary | ICD-10-CM | POA: Diagnosis not present

## 2020-05-01 ENCOUNTER — Ambulatory Visit: Payer: Federal, State, Local not specified - PPO | Admitting: Family Medicine

## 2020-05-01 ENCOUNTER — Encounter: Payer: Self-pay | Admitting: Family Medicine

## 2020-05-01 VITALS — BP 155/93 | HR 53 | Ht 69.0 in | Wt 224.0 lb

## 2020-05-01 DIAGNOSIS — G4733 Obstructive sleep apnea (adult) (pediatric): Secondary | ICD-10-CM

## 2020-05-01 DIAGNOSIS — Z9989 Dependence on other enabling machines and devices: Secondary | ICD-10-CM | POA: Diagnosis not present

## 2020-05-01 NOTE — Patient Instructions (Signed)
Please continue using your CPAP regularly. While your insurance requires that you use CPAP at least 4 hours each night on 70% of the nights, I recommend, that you not skip any nights and use it throughout the night if you can. Getting used to CPAP and staying with the treatment long term does take time and patience and discipline. Untreated obstructive sleep apnea when it is moderate to severe can have an adverse impact on cardiovascular health and raise her risk for heart disease, arrhythmias, hypertension, congestive heart failure, stroke and diabetes. Untreated obstructive sleep apnea causes sleep disruption, nonrestorative sleep, and sleep deprivation. This can have an impact on your day to day functioning and cause daytime sleepiness and impairment of cognitive function, memory loss, mood disturbance, and problems focussing. Using CPAP regularly can improve these symptoms.   Follow up in 1 year   Sleep Apnea Sleep apnea affects breathing during sleep. It causes breathing to stop for a short time or to become shallow. It can also increase the risk of:  Heart attack.  Stroke.  Being very overweight (obese).  Diabetes.  Heart failure.  Irregular heartbeat. The goal of treatment is to help you breathe normally again. What are the causes? There are three kinds of sleep apnea:  Obstructive sleep apnea. This is caused by a blocked or collapsed airway.  Central sleep apnea. This happens when the brain does not send the right signals to the muscles that control breathing.  Mixed sleep apnea. This is a combination of obstructive and central sleep apnea. The most common cause of this condition is a collapsed or blocked airway. This can happen if:  Your throat muscles are too relaxed.  Your tongue and tonsils are too large.  You are overweight.  Your airway is too small.   What increases the risk?  Being overweight.  Smoking.  Having a small airway.  Being older.  Being  male.  Drinking alcohol.  Taking medicines to calm yourself (sedatives or tranquilizers).  Having family members with the condition. What are the signs or symptoms?  Trouble staying asleep.  Being sleepy or tired during the day.  Getting angry a lot.  Loud snoring.  Headaches in the morning.  Not being able to focus your mind (concentrate).  Forgetting things.  Less interest in sex.  Mood swings.  Personality changes.  Feelings of sadness (depression).  Waking up a lot during the night to pee (urinate).  Dry mouth.  Sore throat. How is this diagnosed?  Your medical history.  A physical exam.  A test that is done when you are sleeping (sleep study). The test is most often done in a sleep lab but may also be done at home. How is this treated?  Sleeping on your side.  Using a medicine to get rid of mucus in your nose (decongestant).  Avoiding the use of alcohol, medicines to help you relax, or certain pain medicines (narcotics).  Losing weight, if needed.  Changing your diet.  Not smoking.  Using a machine to open your airway while you sleep, such as: ? An oral appliance. This is a mouthpiece that shifts your lower jaw forward. ? A CPAP device. This device blows air through a mask when you breathe out (exhale). ? An EPAP device. This has valves that you put in each nostril. ? A BPAP device. This device blows air through a mask when you breathe in (inhale) and breathe out.  Having surgery if other treatments do not work. It   is important to get treatment for sleep apnea. Without treatment, it can lead to:  High blood pressure.  Coronary artery disease.  In men, not being able to have an erection (impotence).  Reduced thinking ability.   Follow these instructions at home: Lifestyle  Make changes that your doctor recommends.  Eat a healthy diet.  Lose weight if needed.  Avoid alcohol, medicines to help you relax, and some pain  medicines.  Do not use any products that contain nicotine or tobacco, such as cigarettes, e-cigarettes, and chewing tobacco. If you need help quitting, ask your doctor. General instructions  Take over-the-counter and prescription medicines only as told by your doctor.  If you were given a machine to use while you sleep, use it only as told by your doctor.  If you are having surgery, make sure to tell your doctor you have sleep apnea. You may need to bring your device with you.  Keep all follow-up visits as told by your doctor. This is important. Contact a doctor if:  The machine that you were given to use during sleep bothers you or does not seem to be working.  You do not get better.  You get worse. Get help right away if:  Your chest hurts.  You have trouble breathing in enough air.  You have an uncomfortable feeling in your back, arms, or stomach.  You have trouble talking.  One side of your body feels weak.  A part of your face is hanging down. These symptoms may be an emergency. Do not wait to see if the symptoms will go away. Get medical help right away. Call your local emergency services (911 in the [place] Do not drive yourself to the hospital. Summary  This condition affects breathing during sleep.  The most common cause is a collapsed or blocked airway.  The goal of treatment is to help you breathe normally while you sleep. This information is not intended to replace advice given to you by your health care provider. Make sure you discuss any questions you have with your health care provider. Document Revised: 10/10/2017 Document Reviewed: 08/19/2017 Elsevier Patient Education  2021 Elsevier Inc.  

## 2020-05-01 NOTE — Progress Notes (Signed)
Cm sent to [facility] 
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
## 2020-05-30 ENCOUNTER — Ambulatory Visit: Payer: Federal, State, Local not specified - PPO | Admitting: Family Medicine

## 2020-06-01 ENCOUNTER — Ambulatory Visit: Payer: Federal, State, Local not specified - PPO | Admitting: Family Medicine

## 2020-06-01 ENCOUNTER — Other Ambulatory Visit: Payer: Self-pay

## 2020-06-01 ENCOUNTER — Encounter: Payer: Self-pay | Admitting: Family Medicine

## 2020-06-01 ENCOUNTER — Ambulatory Visit: Payer: Self-pay

## 2020-06-01 VITALS — BP 140/88 | HR 61 | Ht 69.0 in | Wt 226.2 lb

## 2020-06-01 DIAGNOSIS — M25521 Pain in right elbow: Secondary | ICD-10-CM | POA: Diagnosis not present

## 2020-06-01 DIAGNOSIS — M67919 Unspecified disorder of synovium and tendon, unspecified shoulder: Secondary | ICD-10-CM

## 2020-06-01 DIAGNOSIS — M7711 Lateral epicondylitis, right elbow: Secondary | ICD-10-CM

## 2020-06-01 DIAGNOSIS — M719 Bursopathy, unspecified: Secondary | ICD-10-CM | POA: Diagnosis not present

## 2020-06-01 MED ORDER — NITROGLYCERIN 0.2 MG/HR TD PT24: MEDICATED_PATCH | TRANSDERMAL | 1 refills | Status: DC

## 2020-06-01 NOTE — Patient Instructions (Addendum)
Good to see you today.  Nitroglycerin Protocol   Apply 1/4 nitroglycerin patch to affected area daily.  Change position of patch within the affected area every 24 hours.  You may experience a headache during the first 1-2 weeks of using the patch, these should subside.  If you experience headaches after beginning nitroglycerin patch treatment, you may take your preferred over the counter pain reliever.  Another side effect of the nitroglycerin patch is skin irritation or rash related to patch adhesive.  Please notify our office if you develop more severe headaches or rash, and stop the patch.  Tendon healing with nitroglycerin patch may require 12 to 24 weeks depending on the extent of injury.  Men should not use if taking Viagra, Cialis, or Levitra.   Do not use if you have migraines or rosacea.    Plan for PT  Use the counterforce brace as needed.  In a pinch a wrist brace will help  Restring the racket at a lower tension.   Recheck in 6 weeks.   Let me know sooner if this is not working.

## 2020-06-01 NOTE — Progress Notes (Signed)
I, Cole Jones, LAT, ATC, am serving as scribe for Dr. Lynne Leader.  Cole Jones is a 50 y.o. male who presents to [facility] today for R elbow/arm pain x one month that worsened since last Wed after playing tennis. Pt was last seen by Dr. Georgina Snell on 06/17/19 for R gastroc strain. Today, pt reports R lateral elbow and forearm/wrist. Pt locates pain to his R lateral epicondyle that radiates into his R forearm/wrist.  Radiates: yes into the R forearm and wrist Grip strength: decreased on the R UE Numbness/tingling: tingling in his R dorsal wrist Aggravates:typing; gripping; R elbow and wrist AROM Treatments tried: exercises; ice; ice massage; Advil; massage gun   Pertinent review of systems: No fevers or chills  Relevant historical information: History calf strain.  Sleep apnea   Exam:  BP 140/88 (BP Location: Left Arm, Patient Position: Sitting, Cuff Size: Normal)   Pulse 61   Ht 5\' 9"  (1.753 m)   Wt 226 lb 3.2 oz (102.6 kg)   SpO2 98%   BMI 33.40 kg/m  General: Well Developed, well nourished, and in no acute distress.   MSK: Right shoulder normal-appearing Normal motion pain with abduction. Positive Hawkins and Neer's test. Intact strength pain with abduction. Negative Yergason's and speeds test.  Right elbow normal. Tender palpation lateral epicondyle. Pain with resisted wrist extension. Elbow motion and strength are intact.    Lab and Radiology Results  Diagnostic Limited MSK Ultrasound of: Right shoulder Biceps tendon intact and normal. Subscapularis tendon is intact and Supraspinatus tendon is intact.  Moderate subacromial bursitis present. Infraspinatus tendon is intact. AC joint normal Impression: Subacromial bursitis  Diagnostic Limited MSK Ultrasound of: Right lateral elbow Lateral epicondyle small avulsion fleck present at superficial portion of lateral epicondyle consistent with lateral epicondylitis. No tears are  present had common extensor tendon insertion. Impression: Lateral epicondylitis    Assessment and Plan: 50 y.o. male with lateral epicondylitis.  Very painful and significant at this time.  No evidence of tear based on ultrasound appearance.  Plan for home exercise and physical therapy referral.  We will also use nitroglycerin patch protocol.  Recheck in 6 weeks.  Recommend abstaining from tennis for now.  Right shoulder pain due to subacromial bursitis.  Physical therapy main shortness of treatment.  Recheck in 6 weeks.  If not better consider injection or MRI.  Again recommend abstaining from tennis.   PDMP not reviewed this encounter. Orders Placed This Encounter  Procedures  . [place] LIMITED JOINT SPACE STRUCTURES UP RIGHT(NO LINKED CHARGES)    Order Specific Question:   Reason for Exam (SYMPTOM  OR DIAGNOSIS REQUIRED)    Answer:   R elbow pain    Order Specific Question:   Preferred imaging location?    Answer:   [place]  . Ambulatory referral to Physical Therapy    Referral Priority:   Routine    Referral Type:   Physical Medicine    Referral Reason:   Specialty Services Required    Requested Specialty:   Physical Therapy   Meds ordered this encounter  Medications  . nitroGLYCERIN (NITRODUR - DOSED IN MG/24 HR) 0.2 mg/hr patch    Sig: Apply 1/4 patch daily to tendon for tendonitis.    Dispense:  30 patch    Refill:  1     Discussed warning signs or symptoms. Please see discharge instructions. Patient expresses understanding.   The above documentation has been reviewed and  is accurate and complete Lynne Leader, M.D.

## 2020-06-02 ENCOUNTER — Ambulatory Visit (HOSPITAL_BASED_OUTPATIENT_CLINIC_OR_DEPARTMENT_OTHER): Payer: Federal, State, Local not specified - PPO | Attending: Family Medicine | Admitting: Physical Therapy

## 2020-06-02 ENCOUNTER — Encounter (HOSPITAL_BASED_OUTPATIENT_CLINIC_OR_DEPARTMENT_OTHER): Payer: Self-pay | Admitting: Physical Therapy

## 2020-06-02 DIAGNOSIS — M25511 Pain in right shoulder: Secondary | ICD-10-CM | POA: Diagnosis not present

## 2020-06-02 DIAGNOSIS — G8929 Other chronic pain: Secondary | ICD-10-CM | POA: Insufficient documentation

## 2020-06-02 DIAGNOSIS — M6281 Muscle weakness (generalized): Secondary | ICD-10-CM

## 2020-06-02 DIAGNOSIS — M25521 Pain in right elbow: Secondary | ICD-10-CM | POA: Diagnosis not present
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
## 2020-06-02 NOTE — Patient Instructions (Signed)
Patient given HEP on previous rounds of PT including shoulder extension, scap retraction sidelying er; wand flexion and wand er;   Elbow active supination; wrist flexion extension; elbow TFM;

## 2020-06-06 ENCOUNTER — Encounter (HOSPITAL_BASED_OUTPATIENT_CLINIC_OR_DEPARTMENT_OTHER): Payer: Self-pay | Admitting: Physical Therapy

## 2020-06-06 ENCOUNTER — Ambulatory Visit (HOSPITAL_BASED_OUTPATIENT_CLINIC_OR_DEPARTMENT_OTHER): Payer: Federal, State, Local not specified - PPO | Admitting: Physical Therapy

## 2020-06-06 ENCOUNTER — Other Ambulatory Visit: Payer: Self-pay

## 2020-06-06 DIAGNOSIS — M25511 Pain in right shoulder: Secondary | ICD-10-CM | POA: Diagnosis not present

## 2020-06-06 DIAGNOSIS — G8929 Other chronic pain: Secondary | ICD-10-CM | POA: Diagnosis not present

## 2020-06-06 DIAGNOSIS — M6281 Muscle weakness (generalized): Secondary | ICD-10-CM

## 2020-06-06 DIAGNOSIS — M25521 Pain in right elbow: Secondary | ICD-10-CM | POA: Diagnosis not present
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
## 2020-06-07 ENCOUNTER — Telehealth: Payer: Self-pay | Admitting: Family Medicine

## 2020-06-07 NOTE — Telephone Encounter (Signed)
Patient called stating that he starting using the Nitroglycerin patches that was prescribed by Dr Georgina Snell on Thursday. He said that he also used some sort of patch while he was at physical therapy yesterday.  He noticed two different "hemorrhage spot" episodes in his eye. He did not know if this could be related to the use of the patches or not. He was very concerned.  Please advise.

## 2020-06-08 NOTE — Telephone Encounter (Signed)
Called pt and left detailed message w/ Dr. Clovis Riley advice to stop the nitroglycerin patches.  Dr. Georgina Snell is happy to see him to look at the "hemorrhage spots" in his eye.  This is not an urgent matter so any time in the next few days/early next week is fine.

## 2020-06-08 NOTE — Telephone Encounter (Signed)
I think that is unlikely but I cannot tell over the phone. I am happy to see the patient if he would like. He can stop the patch and schedule with me in the near future.   I dont have Ilona Sorrel information to give detailed medical advice.

## 2020-06-08 NOTE — Telephone Encounter (Signed)
Spoke to patient. Appointment scheduled and instructed to stop patches.

## 2020-06-09 NOTE — Progress Notes (Signed)
   I, [name], LAT, ATC, am serving as scribe for Dr. Lynne Leader.  Cole Jones is a 50 y.o. male who presents to [place] [facility] today for R lateral epicondylitis, R shoulder pain, and red spots in eye. Pt was last seen by Dr. Georgina Snell on 06/01/20 and was advised to use nitro patches, HEP, and was referred to PT of which he's completed 2 visit. Pt was also advised to abstain from playing tennis. Pt called the office on 06/07/20 reporting much concern over noticing 2 "red spots" in his eye, wondering if this could be related to using the nitro patches. Today, pt reports that the spots in his L eye are improving but still present.  He states that he started the nitroglycerin patches on a Thursday and started noticing the spots in his eyes about 5 days later.  He states that he feels like the nitroglycerin patches were helping and feels like he's regressed a bit since stopping the patches in terms of how his R elbow feels.  The red spot occurred after sneezing.   Pertinent review of systems: No fevers or chills  Relevant historical information: Sleep apnea   Exam:  BP (!) 144/100 (BP Location: Right Arm, Patient Position: Sitting, Cuff Size: Normal)   Pulse 64   Ht 5\' 9"  (1.753 m)   Wt 226 lb 3.2 oz (102.6 kg)   SpO2 98%   BMI 33.40 kg/m  General: Well Developed, well nourished, and in no acute distress.   HEENT: Tiny subconjunctival hemorrhage left lateral cornea. Normal vision      Assessment and Plan: 50 y.o. male with right tennis elbow improving with nitroglycerin patch protocol.  Patient had a left subconjunctival hemorrhage/hematoma a few days after starting nitroglycerin patches.  This occurred after several episodes of sneezing.   I think it is probably unrelated to the nitroglycerin patch protocol.  Patient would like to resume nitroglycerin patches as he was already starting to get better a few days after starting the patch.  Discussed  precautions.  Advised that if this recurs he may have benefit with follow-up with ophthalmology.  Resume patches recheck as scheduled.   Discussed warning signs or symptoms. Please see discharge instructions. Patient expresses understanding.   The above documentation has been reviewed and is accurate and complete Lynne Leader, M.D.

## 2020-06-12 ENCOUNTER — Ambulatory Visit: Payer: Federal, State, Local not specified - PPO | Admitting: Family Medicine

## 2020-06-12 ENCOUNTER — Ambulatory Visit (HOSPITAL_BASED_OUTPATIENT_CLINIC_OR_DEPARTMENT_OTHER): Payer: Federal, State, Local not specified - PPO | Attending: Family Medicine | Admitting: Physical Therapy

## 2020-06-12 ENCOUNTER — Other Ambulatory Visit: Payer: Self-pay

## 2020-06-12 ENCOUNTER — Encounter (HOSPITAL_BASED_OUTPATIENT_CLINIC_OR_DEPARTMENT_OTHER): Payer: Self-pay | Admitting: Physical Therapy

## 2020-06-12 ENCOUNTER — Encounter: Payer: Self-pay | Admitting: Family Medicine

## 2020-06-12 VITALS — BP 144/100 | HR 64 | Ht 69.0 in | Wt 226.2 lb

## 2020-06-12 DIAGNOSIS — M25521 Pain in right elbow: Secondary | ICD-10-CM | POA: Diagnosis not present

## 2020-06-12 DIAGNOSIS — H1132 Conjunctival hemorrhage, left eye: Secondary | ICD-10-CM | POA: Diagnosis not present

## 2020-06-12 DIAGNOSIS — M25511 Pain in right shoulder: Secondary | ICD-10-CM | POA: Insufficient documentation

## 2020-06-12 DIAGNOSIS — G8929 Other chronic pain: Secondary | ICD-10-CM | POA: Insufficient documentation

## 2020-06-12 DIAGNOSIS — M6281 Muscle weakness (generalized): Secondary | ICD-10-CM | POA: Diagnosis not present

## 2020-06-12 DIAGNOSIS — M7711 Lateral epicondylitis, right elbow: Secondary | ICD-10-CM

## 2020-06-12 NOTE — Patient Instructions (Signed)
Thank you for coming in today.  OK to resume nitropatches.   If this returns you may consider stopping the patches and getting an eye eval.   Resume home exercises.   Let me know how you are doing.
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
## 2020-06-16 ENCOUNTER — Telehealth: Payer: Self-pay | Admitting: Family Medicine

## 2020-06-16 NOTE — Telephone Encounter (Signed)
Patient is headed to [place] and forgot his Nitroglycerin patches at home. He has contacted [facility] about the issue as well as the [facility] in [place] in hopes that they would be able to approve the "early refill". He was told they may be in contact with our office.

## 2020-06-19 ENCOUNTER — Other Ambulatory Visit: Payer: Self-pay

## 2020-06-19 ENCOUNTER — Ambulatory Visit (HOSPITAL_BASED_OUTPATIENT_CLINIC_OR_DEPARTMENT_OTHER): Payer: Federal, State, Local not specified - PPO | Admitting: Physical Therapy

## 2020-06-19 ENCOUNTER — Encounter (HOSPITAL_BASED_OUTPATIENT_CLINIC_OR_DEPARTMENT_OTHER): Payer: Self-pay | Admitting: Physical Therapy

## 2020-06-19 DIAGNOSIS — G8929 Other chronic pain: Secondary | ICD-10-CM | POA: Diagnosis not present

## 2020-06-19 DIAGNOSIS — M6281 Muscle weakness (generalized): Secondary | ICD-10-CM | POA: Diagnosis not present

## 2020-06-19 DIAGNOSIS — M25521 Pain in right elbow: Secondary | ICD-10-CM

## 2020-06-19 DIAGNOSIS — M25511 Pain in right shoulder: Secondary | ICD-10-CM | POA: Diagnosis not present
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
## 2020-06-19 NOTE — Patient Instructions (Signed)
Access Code: [email or web address] URL: [email or web address] Date: 06/19/2020 Prepared by: Carolyne Littles  Exercises Supine Shoulder External Rotation in 45 Degrees Abduction AAROM with Dowel - 1 x daily - 7 x weekly - 3 sets - 10 reps Supine Shoulder Flexion Extension AAROM with Dowel - 1 x daily - 7 x weekly - 3 sets - 10 reps Sidelying Shoulder External Rotation - 1 x daily - 7 x weekly - 3 sets - 10 reps Shoulder External Rotation with Anchored Resistance - 1 x daily - 7 x weekly - 3 sets - 10 reps Standing Shoulder Internal Rotation with Anchored Resistance - 1 x daily - 7 x weekly - 3 sets - 10 reps Shoulder extension with resistance - Neutral - 1 x daily - 7 x weekly - 3 sets - 10 reps Scapular Retraction with Resistance - 1 x daily - 7 x weekly - 3 sets - 10 reps Shoulder Flexion Wall Slide with Towel - 1 x daily - 7 x weekly - 3 sets - 10 reps Standing Shoulder Flexion to 90 Degrees with Dumbbells - 1 x daily - 7 x weekly - 3 sets - 10 reps Scaption with Dumbbells - 1 x daily - 7 x weekly - 3 sets - 10 reps Reverse Chop with Resistance - 1 x daily - 7 x weekly - 3 sets - 10 reps Standing Diagonal Chop - 1 x daily - 7 x weekly - 3 sets - 10 reps Squatting Anti-Rotation Press - 1 x daily - 7 x weekly - 3 sets - 10 reps

## 2020-06-19 NOTE — Telephone Encounter (Signed)
Using [employer] a box of nitroglycerin patches is about $25-$21.  So even if your insurance does not want to pay for it it should be affordable.  Please pick a pharmacy near where you are in [place] and I will send a prescription.  Download the good[employer] application to your phone and you should be able to pull the medicine up there and use the coupon that makes it significantly cheaper.    Let me know which pharmacy (name and address please) you picked and I will send the medicine there.

## 2020-06-19 NOTE — Telephone Encounter (Signed)
Left pt a VM asking for name and address of the pharmacy in [place] where he would like the rx ordered. If pt returns call, please obtain this information.

## 2020-06-21 DIAGNOSIS — G4733 Obstructive sleep apnea (adult) (pediatric): Secondary | ICD-10-CM | POA: Diagnosis not present

## 2020-06-22 NOTE — Telephone Encounter (Signed)
Called pt to f/u and he states that he was able to get an early refill through [facility] for the nitro patches so he's been taken care of.

## 2020-06-26 ENCOUNTER — Encounter (HOSPITAL_BASED_OUTPATIENT_CLINIC_OR_DEPARTMENT_OTHER): Payer: Federal, State, Local not specified - PPO | Admitting: Physical Therapy

## 2020-07-03 ENCOUNTER — Ambulatory Visit (HOSPITAL_BASED_OUTPATIENT_CLINIC_OR_DEPARTMENT_OTHER): Payer: Federal, State, Local not specified - PPO | Admitting: Physical Therapy

## 2020-07-03 ENCOUNTER — Other Ambulatory Visit: Payer: Self-pay

## 2020-07-03 ENCOUNTER — Encounter (HOSPITAL_BASED_OUTPATIENT_CLINIC_OR_DEPARTMENT_OTHER): Payer: Self-pay | Admitting: Physical Therapy

## 2020-07-03 DIAGNOSIS — M25521 Pain in right elbow: Secondary | ICD-10-CM | POA: Diagnosis not present

## 2020-07-03 DIAGNOSIS — M6281 Muscle weakness (generalized): Secondary | ICD-10-CM | POA: Diagnosis not present

## 2020-07-03 DIAGNOSIS — M25511 Pain in right shoulder: Secondary | ICD-10-CM | POA: Diagnosis not present

## 2020-07-03 DIAGNOSIS — G8929 Other chronic pain: Secondary | ICD-10-CM | POA: Diagnosis not present
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
## 2020-07-11 NOTE — Progress Notes (Signed)
   I, [name], LAT, ATC, am serving as scribe for Dr. Lynne Leader.  Cole Jones is a 50 y.o. male who presents to [facility] today for f/u R elbow pain thought to be due to lateral epicondylitis. Pt was also seen to evaluate subconjunctival hemorrhage/hematoma that began a few days after starting using the nitro patches. Pt was last seen by Dr. Georgina Snell on 06/12/20 and was advised to resume using nitro patches. Pt has completed a total of 5 PT visits. Today, pt reports that his R elbow is getting better and states that it's "ok."  However, he states that he can't imagine playing tennis at this point due to con't pain w/ gripping activities.  He has been getting dry needling at PT and he has started doing some sport specific exercises.  He has resumed wearing his nitro patches.    Pertinent review of systems: No fevers or chills  Relevant historical information: Hypertension   Exam:  BP (!) 144/90 (BP Location: Right Arm, Patient Position: Sitting, Cuff Size: Normal)   Ht 5\' 9"  (1.753 m)   Wt 228 lb 9.6 oz (103.7 kg)   SpO2 97%   BMI 33.76 kg/m  General: Well Developed, well nourished, and in no acute distress.   MSK: Right elbow normal-appearing Tender palpation mildly lateral epicondyle. Mild pain with resisted wrist extension.    Lab and Radiology Results  Diagnostic Limited MSK Ultrasound of: Right lateral elbow Lateral epicondyle small avulsion fleck present at superficial portion of lateral epicondyle consistent with lateral epicondylitis. No tears are present had common extensor tendon insertion. Impression: Lateral epicondylitis    Assessment and Plan: 50 y.o. male with right lateral elbow pain due to lateral epicondylitis.  Improving with nitroglycerin patch protocol and home exercise program and physical therapy but not fully resolved.  Discussed treatment plan and options at this point.  Plan to give it another 4 to 6 weeks.  If  not good enough to return to tennis at that point would likely proceed to MRI to further characterize cause of pain and for potential PRP or surgical planning.  Right shoulder pain has significantly improved to resolved with physical therapy.  Watchful waiting for this issue and.   Discussed warning signs or symptoms. Please see discharge instructions. Patient expresses understanding.   The above documentation has been reviewed and is accurate and complete Lynne Leader, M.D. Total encounter time 20 minutes including face-to-face time with the patient and, reviewing past medical record, and charting on the date of service.   Treatment plan and options

## 2020-07-13 ENCOUNTER — Other Ambulatory Visit: Payer: Self-pay

## 2020-07-13 ENCOUNTER — Ambulatory Visit: Payer: Federal, State, Local not specified - PPO | Admitting: Family Medicine

## 2020-07-13 ENCOUNTER — Encounter: Payer: Self-pay | Admitting: Family Medicine

## 2020-07-13 VITALS — BP 144/90 | Ht 69.0 in | Wt 228.6 lb

## 2020-07-13 DIAGNOSIS — M7711 Lateral epicondylitis, right elbow: Secondary | ICD-10-CM

## 2020-07-13 NOTE — Patient Instructions (Addendum)
Thank you for coming in today.   Continue PT and home exercises.   If not improved significantly in 4-6 weeks next step is probably MRI for PRP or surgical planning.   Keep me updated.   Ok to continue nitro patches.

## 2020-07-17 ENCOUNTER — Ambulatory Visit (HOSPITAL_BASED_OUTPATIENT_CLINIC_OR_DEPARTMENT_OTHER): Payer: Federal, State, Local not specified - PPO | Attending: Family Medicine | Admitting: Physical Therapy

## 2020-07-17 ENCOUNTER — Encounter (HOSPITAL_BASED_OUTPATIENT_CLINIC_OR_DEPARTMENT_OTHER): Payer: Self-pay | Admitting: Physical Therapy

## 2020-07-17 ENCOUNTER — Other Ambulatory Visit: Payer: Self-pay

## 2020-07-17 DIAGNOSIS — G8929 Other chronic pain: Secondary | ICD-10-CM | POA: Diagnosis not present

## 2020-07-17 DIAGNOSIS — M6281 Muscle weakness (generalized): Secondary | ICD-10-CM | POA: Diagnosis not present

## 2020-07-17 DIAGNOSIS — M25521 Pain in right elbow: Secondary | ICD-10-CM | POA: Diagnosis not present

## 2020-07-17 DIAGNOSIS — M25511 Pain in right shoulder: Secondary | ICD-10-CM | POA: Insufficient documentation

## 2020-07-17 NOTE — Addendum Note (Signed)
Addended by: Carney Living on: 07/17/2020 03:29 PM   Modules accepted: Orders
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
## 2020-07-28 ENCOUNTER — Ambulatory Visit (HOSPITAL_BASED_OUTPATIENT_CLINIC_OR_DEPARTMENT_OTHER): Payer: Federal, State, Local not specified - PPO | Admitting: Physical Therapy

## 2020-10-02 DIAGNOSIS — G4733 Obstructive sleep apnea (adult) (pediatric): Secondary | ICD-10-CM | POA: Diagnosis not present

## 2020-11-01 DIAGNOSIS — G4733 Obstructive sleep apnea (adult) (pediatric): Secondary | ICD-10-CM | POA: Diagnosis not present

## 2020-12-02 DIAGNOSIS — G4733 Obstructive sleep apnea (adult) (pediatric): Secondary | ICD-10-CM | POA: Diagnosis not present

## 2020-12-04 DIAGNOSIS — E785 Hyperlipidemia, unspecified: Secondary | ICD-10-CM | POA: Diagnosis not present

## 2020-12-04 DIAGNOSIS — Z125 Encounter for screening for malignant neoplasm of prostate: Secondary | ICD-10-CM | POA: Diagnosis not present

## 2020-12-08 DIAGNOSIS — Z1339 Encounter for screening examination for other mental health and behavioral disorders: Secondary | ICD-10-CM | POA: Diagnosis not present

## 2020-12-08 DIAGNOSIS — J302 Other seasonal allergic rhinitis: Secondary | ICD-10-CM | POA: Diagnosis not present

## 2020-12-08 DIAGNOSIS — Z23 Encounter for immunization: Secondary | ICD-10-CM | POA: Diagnosis not present

## 2020-12-08 DIAGNOSIS — Z1331 Encounter for screening for depression: Secondary | ICD-10-CM | POA: Diagnosis not present

## 2020-12-08 DIAGNOSIS — Z0000 Encounter for general adult medical examination without abnormal findings: Secondary | ICD-10-CM | POA: Diagnosis not present

## 2020-12-08 DIAGNOSIS — R82998 Other abnormal findings in urine: Secondary | ICD-10-CM | POA: Diagnosis not present

## 2021-01-10 ENCOUNTER — Ambulatory Visit: Payer: Federal, State, Local not specified - PPO | Admitting: Podiatry

## 2021-01-10 ENCOUNTER — Encounter: Payer: Self-pay | Admitting: Podiatry

## 2021-01-10 ENCOUNTER — Other Ambulatory Visit: Payer: Self-pay

## 2021-01-10 DIAGNOSIS — M722 Plantar fascial fibromatosis: Secondary | ICD-10-CM | POA: Diagnosis not present

## 2021-01-10 DIAGNOSIS — L600 Ingrowing nail: Secondary | ICD-10-CM | POA: Diagnosis not present

## 2021-01-10 NOTE — Patient Instructions (Addendum)
Place 1/4 cup of epsom salts in a quart of warm tap water.  Submerge your foot or feet in the solution and soak for 20 minutes.  This soak should be done twice a day.  Next, remove your foot or feet from solution, blot dry the affected area. Apply ointment and cover if instructed by your doctor.   IF YOUR SKIN BECOMES IRRITATED WHILE USING THESE INSTRUCTIONS, IT IS OKAY TO SWITCH TO  WHITE VINEGAR AND WATER.  As another alternative soak, you may use antibacterial soap and water.  Monitor for any signs/symptoms of infection. Call the office immediately if any occur or go directly to the emergency room. Call with any questions/concerns.   Plantar Fasciitis (Heel Spur Syndrome) with Rehab The plantar fascia is a fibrous, ligament-like, soft-tissue structure that spans the bottom of the foot. Plantar fasciitis is a condition that causes pain in the foot due to inflammation of the tissue. SYMPTOMS  Pain and tenderness on the underneath side of the foot. Pain that worsens with standing or walking. CAUSES  Plantar fasciitis is caused by irritation and injury to the plantar fascia on the underneath side of the foot. Common mechanisms of injury include: Direct trauma to bottom of the foot. Damage to a small nerve that runs under the foot where the main fascia attaches to the heel bone. Stress placed on the plantar fascia due to bone spurs. RISK INCREASES WITH:  Activities that place stress on the plantar fascia (running, jumping, pivoting, or cutting). Poor strength and flexibility. Improperly fitted shoes. Tight calf muscles. Flat feet. Failure to warm-up properly before activity. Obesity. PREVENTION Warm up and stretch properly before activity. Allow for adequate recovery between workouts. Maintain physical fitness: Strength, flexibility, and endurance. Cardiovascular fitness. Maintain a health body weight. Avoid stress on the plantar fascia. Wear properly fitted shoes, including arch  supports for individuals who have flat feet.  PROGNOSIS  If treated properly, then the symptoms of plantar fasciitis usually resolve without surgery. However, occasionally surgery is necessary.  RELATED COMPLICATIONS  Recurrent symptoms that may result in a chronic condition. Problems of the lower back that are caused by compensating for the injury, such as limping. Pain or weakness of the foot during push-off following surgery. Chronic inflammation, scarring, and partial or complete fascia tear, occurring more often from repeated injections.  TREATMENT  Treatment initially involves the use of ice and medication to help reduce pain and inflammation. The use of strengthening and stretching exercises may help reduce pain with activity, especially stretches of the Achilles tendon. These exercises may be performed at home or with a therapist. Your caregiver may recommend that you use heel cups of arch supports to help reduce stress on the plantar fascia. Occasionally, corticosteroid injections are given to reduce inflammation. If symptoms persist for greater than 6 months despite non-surgical (conservative), then surgery may be recommended.   MEDICATION  If pain medication is necessary, then nonsteroidal anti-inflammatory medications, such as aspirin and ibuprofen, or other minor pain relievers, such as acetaminophen, are often recommended. Do not take pain medication within 7 days before surgery. Prescription pain relievers may be given if deemed necessary by your caregiver. Use only as directed and only as much as you need. Corticosteroid injections may be given by your caregiver. These injections should be reserved for the most serious cases, because they may only be given a certain number of times.  HEAT AND COLD Cold treatment (icing) relieves pain and reduces inflammation. Cold treatment should be applied   for 10 to 15 minutes every 2 to 3 hours for inflammation and pain and immediately after  any activity that aggravates your symptoms. Use ice packs or massage the area with a piece of ice (ice massage). Heat treatment may be used prior to performing the stretching and strengthening activities prescribed by your caregiver, physical therapist, or athletic trainer. Use a heat pack or soak the injury in warm water.  SEEK IMMEDIATE MEDICAL CARE IF: Treatment seems to offer no benefit, or the condition worsens. Any medications produce adverse side effects.  EXERCISES- RANGE OF MOTION (ROM) AND STRETCHING EXERCISES - Plantar Fasciitis (Heel Spur Syndrome) These exercises may help you when beginning to rehabilitate your injury. Your symptoms may resolve with or without further involvement from your physician, physical therapist or athletic trainer. While completing these exercises, remember:  Restoring tissue flexibility helps normal motion to return to the joints. This allows healthier, less painful movement and activity. An effective stretch should be held for at least 30 seconds. A stretch should never be painful. You should only feel a gentle lengthening or release in the stretched tissue.  RANGE OF MOTION - Toe Extension, Flexion Sit with your right / left leg crossed over your opposite knee. Grasp your toes and gently pull them back toward the top of your foot. You should feel a stretch on the bottom of your toes and/or foot. Hold this stretch for 10 seconds. Now, gently pull your toes toward the bottom of your foot. You should feel a stretch on the top of your toes and or foot. Hold this stretch for 10 seconds. Repeat  times. Complete this stretch 3 times per day.   RANGE OF MOTION - Ankle Dorsiflexion, Active Assisted Remove shoes and sit on a chair that is preferably not on a carpeted surface. Place right / left foot under knee. Extend your opposite leg for support. Keeping your heel down, slide your right / left foot back toward the chair until you feel a stretch at your ankle  or calf. If you do not feel a stretch, slide your bottom forward to the edge of the chair, while still keeping your heel down. Hold this stretch for 10 seconds. Repeat 3 times. Complete this stretch 2 times per day.   STRETCH  Gastroc, Standing Place hands on wall. Extend right / left leg, keeping the front knee somewhat bent. Slightly point your toes inward on your back foot. Keeping your right / left heel on the floor and your knee straight, shift your weight toward the wall, not allowing your back to arch. You should feel a gentle stretch in the right / left calf. Hold this position for 10 seconds. Repeat 3 times. Complete this stretch 2 times per day.  STRETCH  Soleus, Standing Place hands on wall. Extend right / left leg, keeping the other knee somewhat bent. Slightly point your toes inward on your back foot. Keep your right / left heel on the floor, bend your back knee, and slightly shift your weight over the back leg so that you feel a gentle stretch deep in your back calf. Hold this position for 10 seconds. Repeat 3 times. Complete this stretch 2 times per day.  STRETCH  Gastrocsoleus, Standing  Note: This exercise can place a lot of stress on your foot and ankle. Please complete this exercise only if specifically instructed by your caregiver.  Place the ball of your right / left foot on a step, keeping your other foot firmly on the   same step. Hold on to the wall or a rail for balance. Slowly lift your other foot, allowing your body weight to press your heel down over the edge of the step. You should feel a stretch in your right / left calf. Hold this position for 10 seconds. Repeat this exercise with a slight bend in your right / left knee. Repeat 3 times. Complete this stretch 2 times per day.   STRENGTHENING EXERCISES - Plantar Fasciitis (Heel Spur Syndrome)  These exercises may help you when beginning to rehabilitate your injury. They may resolve your symptoms with or  without further involvement from your physician, physical therapist or athletic trainer. While completing these exercises, remember:  Muscles can gain both the endurance and the strength needed for everyday activities through controlled exercises. Complete these exercises as instructed by your physician, physical therapist or athletic trainer. Progress the resistance and repetitions only as guided.  STRENGTH - Towel Curls Sit in a chair positioned on a non-carpeted surface. Place your foot on a towel, keeping your heel on the floor. Pull the towel toward your heel by only curling your toes. Keep your heel on the floor. Repeat 3 times. Complete this exercise 2 times per day.  STRENGTH - Ankle Inversion Secure one end of a rubber exercise band/tubing to a fixed object (table, pole). Loop the other end around your foot just before your toes. Place your fists between your knees. This will focus your strengthening at your ankle. Slowly, pull your big toe up and in, making sure the band/tubing is positioned to resist the entire motion. Hold this position for 10 seconds. Have your muscles resist the band/tubing as it slowly pulls your foot back to the starting position. Repeat 3 times. Complete this exercises 2 times per day.  Document Released: 12/24/2004 Document Revised: 03/18/2011 Document Reviewed: 04/07/2008 [employer] Patient Information 2014 [employer].  

## 2021-01-11 NOTE — Progress Notes (Signed)
Subjective:   Patient ID: Cole Jones, male   DOB: 51 y.o.   MRN: [id number]   HPI Patient presents stating he has had a split in his big toenail right and its become very painful over the last month and hard to wear shoe gear with comfortably and he cannot trim it himself and has had mild to moderate pain in his right heel over the last 4 months that he has been stretching.  Patient does not smoke likes to be active   Review of Systems  All other systems reviewed and are negative.      Objective:  Physical Exam Vitals and nursing note reviewed.  Constitutional:      Appearance: He is well-developed.  Pulmonary:     Effort: Pulmonary effort is normal.  Musculoskeletal:        General: Normal range of motion.  Skin:    General: Skin is warm.  Neurological:     Mental Status: He is alert.    Neurovascular status intact muscle strength found to be adequate range of motion adequate with patient noted to have 2 separate problems with 1 being incurvation of the medial border right hallux that is quite sore when pressed and secondarily discomfort in the right heel with inflammation of the low to mid grade nature.  Patient is found to have good digital perfusion well oriented x3      Assessment:  Chronic ingrown toenail deformity of the right hallux medial border with pain and also mild to moderate Planter fasciitis right     Plan:  H&P reviewed condition and at this point I have recommended correction of the ingrown toenail.  I explained procedure risk he wants surgery I allowed him to read consent form and then I infiltrated the right hallux 60 mg like Marcaine mixture sterile prep done and using sterile instrumentation remove the medial border exposed matrix and applied phenol 3 applications 30 seconds followed by alcohol lavage sterile dressing and gave instructions on soaks.  Then discussed the Planter fasciitis do not recommend current treatment but if symptoms were to get  worse we will treat more aggressively but today physical therapy was dispensed to the patient with instructions on home usage

## 2021-01-25 DIAGNOSIS — E8881 Metabolic syndrome: Secondary | ICD-10-CM | POA: Diagnosis not present

## 2021-01-29 IMAGING — CT CT CARDIAC CORONARY ARTERY CALCIUM SCORE
3 series · 14 of 20 positions shown, 16 images · non-contrast
Comparison: None.

CLINICAL DATA: 49-year-old white male with hyperlipidemia.

EXAM:
CT CARDIAC CORONARY ARTERY CALCIUM SCORE
TECHNIQUE: Non-contrast imaging through the heart was performed using
prospective ECG gating. Image post processing was performed on an
independent workstation, allowing for quantitative analysis of the
heart and coronary arteries. Note that this exam targets the heart
and the chest was not imaged in its entirety.

[Series 2: calcium scoring 2.00 qr36 bestdiast 69% hrt calciu · axial · 0.38mm/px · z∈[+1889,+1961]mm · 4 of 60 slices shown]
[im 12/60  vessel]
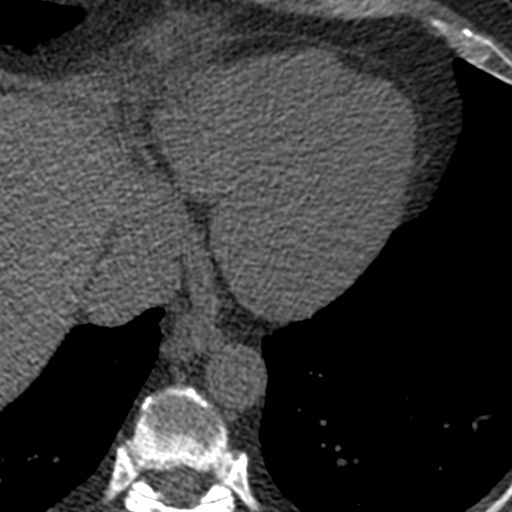
[im 24/60  vessel]
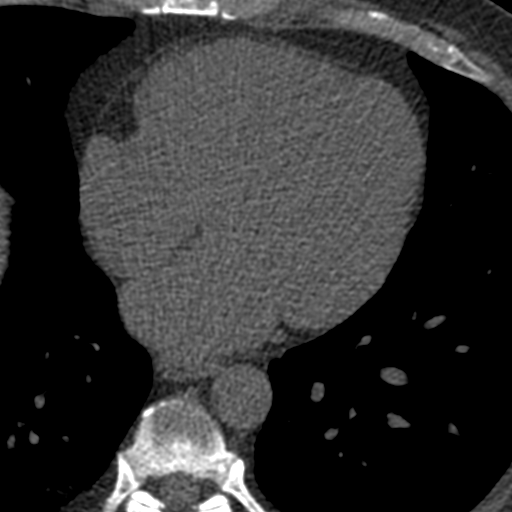
[im 36/60  vessel]
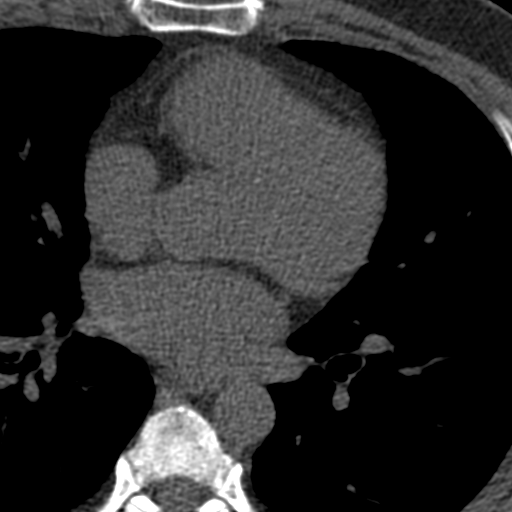
[im 48/60  vessel]
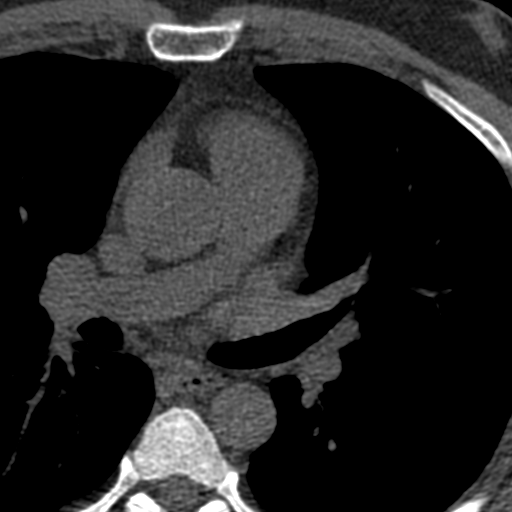

[Series 3: calcium scoring 2.00 br40 bestdiast 69% axial · axial · 0.60mm/px · z∈[+1885,+1965]mm · 5 of 60 slices shown, 7 images]
[im 10/60  vessel]
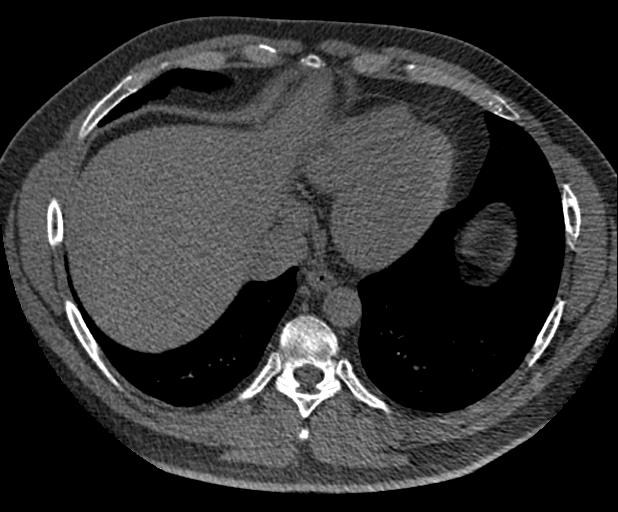
[im 10/60  lung]
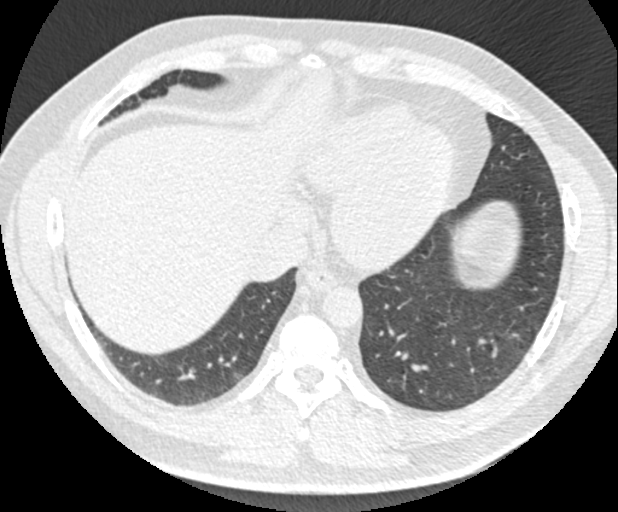
[im 20/60  vessel]
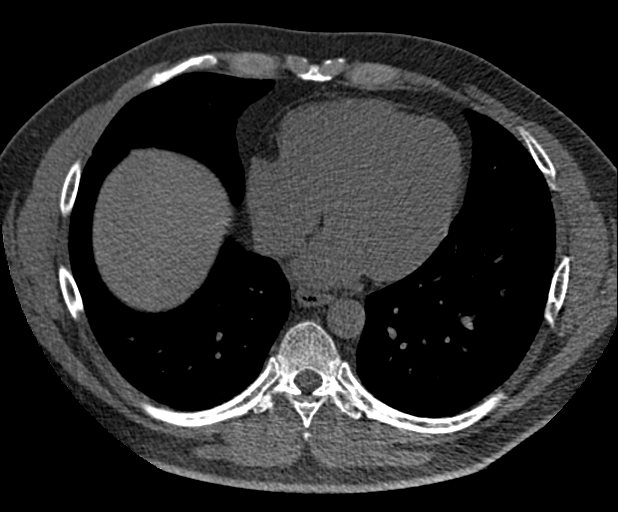
[im 30/60  vessel]
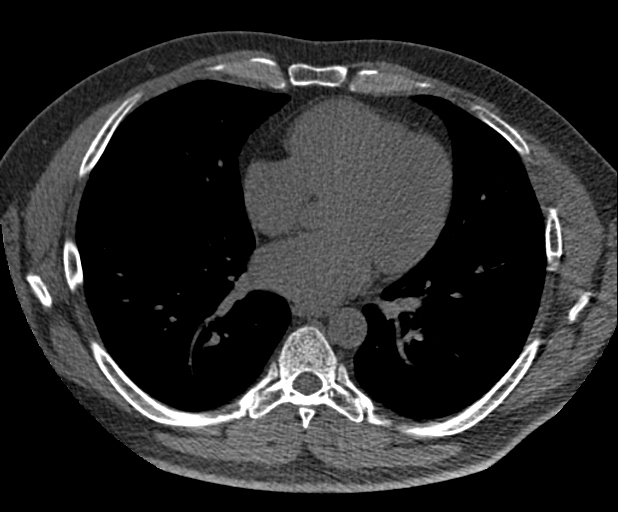
[im 40/60  vessel]
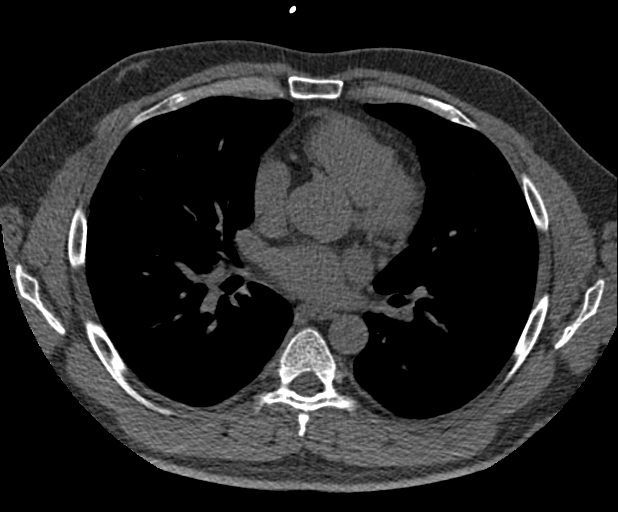
[im 50/60  vessel]
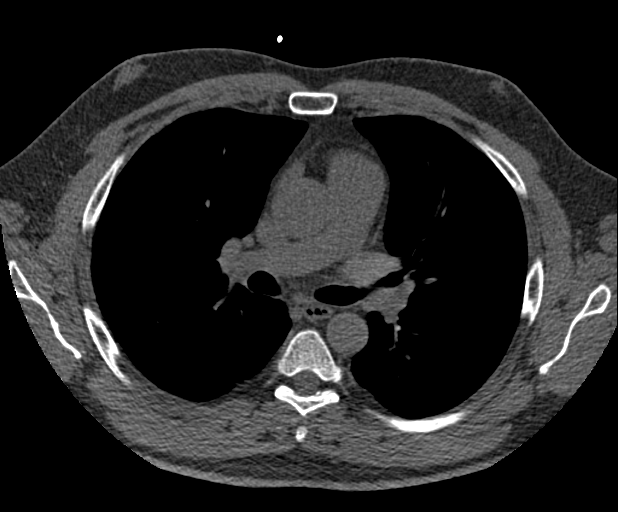
[im 50/60  lung]
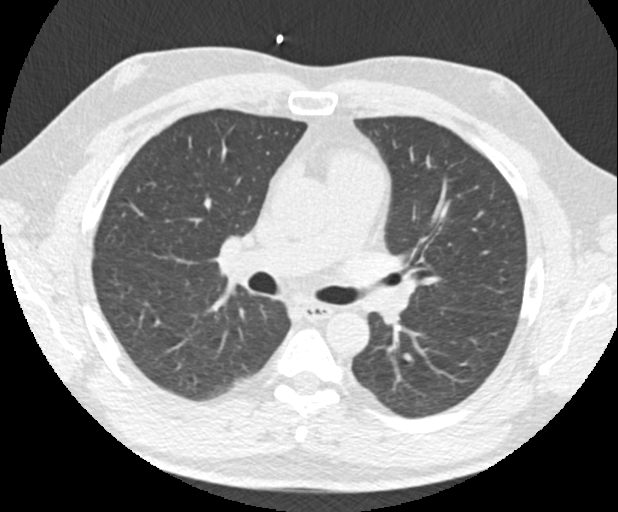

[Series 9: calcium scoring 2.00 br60 bestdiast 69% lungs · axial · 0.58mm/px · z∈[+1885,+1965]mm · 5 of 60 slices shown]
[im 10/60  vessel]
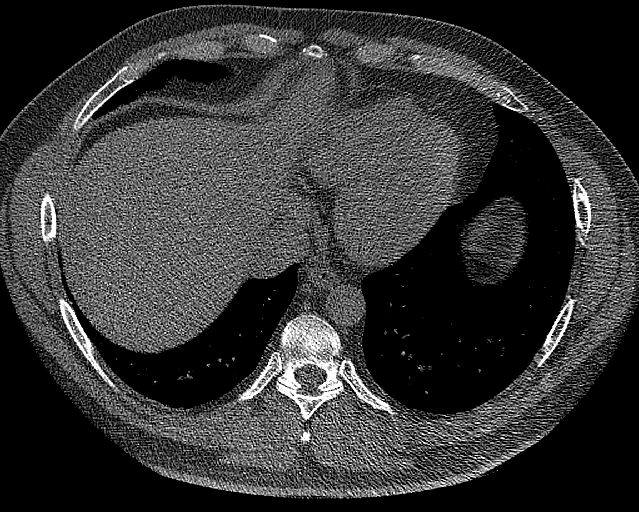
[im 20/60  vessel]
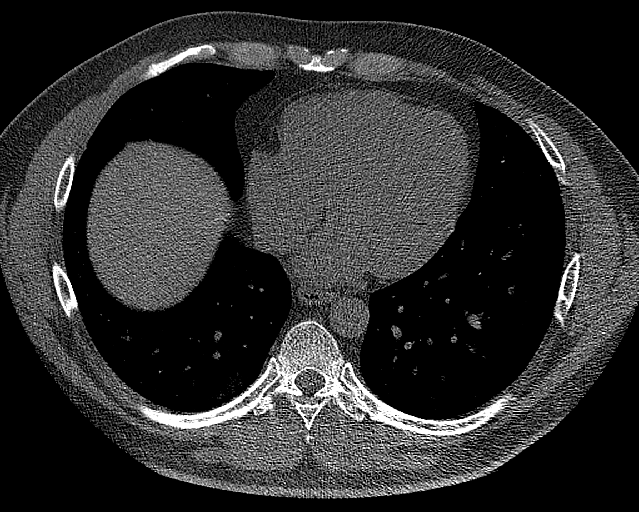
[im 30/60  vessel]
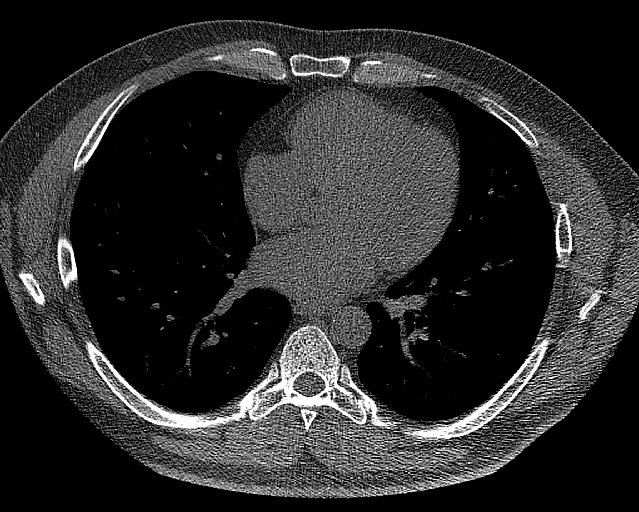
[im 40/60  vessel]
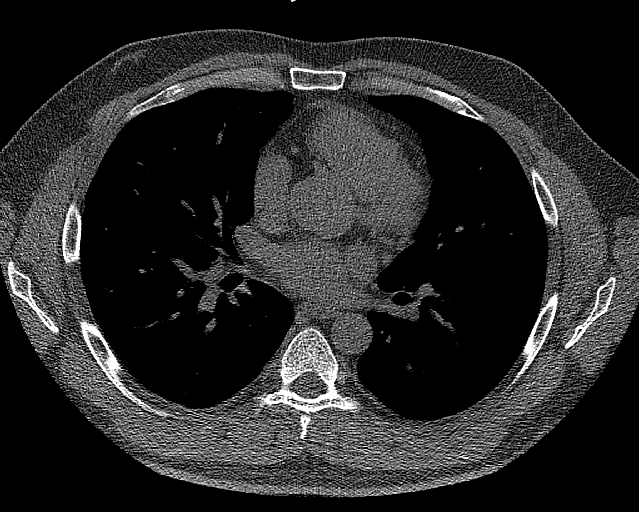
[im 50/60  vessel]
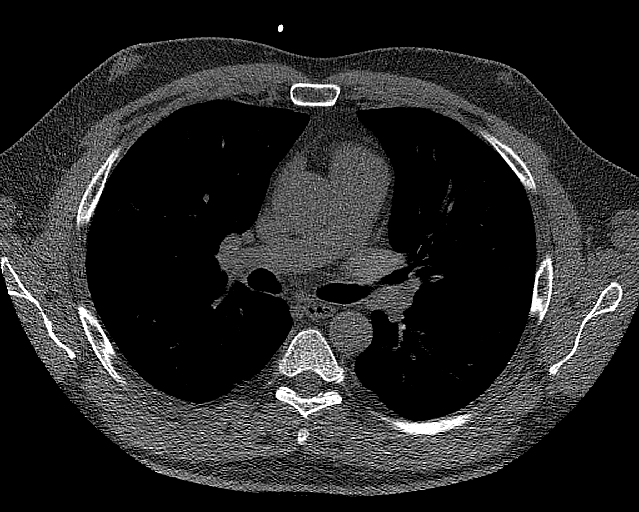

[14 of 20 positions shown; findings below may reference images not displayed]

FINDINGS: CORONARY CALCIUM SCORES:

Left Main: 0

LAD: 0

LCx: 0

RCA: 0

Total Agatston Score: 0

[HOSPITAL] percentile: 0

AORTA MEASUREMENTS:

Ascending Aorta: 36 mm

Descending Aorta: 27 mm

OTHER FINDINGS:

Visualized mediastinal structures are unremarkable. Small lower
mediastinal lymph nodes along the distal esophagus are within normal
limits. Normal appearance of the visualized upper abdominal
structures. Visualized lungs are clear except for peripheral
densities along the medial posterior aspect of the right lung.
Suspect these findings are related to atelectasis. No acute bone
abnormality. There is some artifact involving the left ribs.
IMPRESSION: 1. No evidence for coronary artery calcium. Coronary calcium score
is 0.
2. Peripheral posterior densities in the right lower lobe are
nonspecific but likely represent atelectasis.

## 2021-02-20 DIAGNOSIS — G4733 Obstructive sleep apnea (adult) (pediatric): Secondary | ICD-10-CM | POA: Diagnosis not present

## 2021-03-20 DIAGNOSIS — G4733 Obstructive sleep apnea (adult) (pediatric): Secondary | ICD-10-CM | POA: Diagnosis not present

## 2021-04-20 DIAGNOSIS — G4733 Obstructive sleep apnea (adult) (pediatric): Secondary | ICD-10-CM | POA: Diagnosis not present

## 2021-05-02 ENCOUNTER — Ambulatory Visit: Payer: Federal, State, Local not specified - PPO | Admitting: Family Medicine

## 2021-05-02 ENCOUNTER — Encounter: Payer: Self-pay | Admitting: Family Medicine

## 2021-05-02 VITALS — BP 103/73 | HR 60 | Ht 69.0 in | Wt 208.8 lb

## 2021-05-02 DIAGNOSIS — G4733 Obstructive sleep apnea (adult) (pediatric): Secondary | ICD-10-CM

## 2021-05-02 DIAGNOSIS — Z9989 Dependence on other enabling machines and devices: Secondary | ICD-10-CM | POA: Diagnosis not present

## 2021-05-02 DIAGNOSIS — E785 Hyperlipidemia, unspecified: Secondary | ICD-10-CM | POA: Diagnosis not present

## 2021-05-02 NOTE — Progress Notes (Signed)
CM sent to [facility] for new order ?

## 2021-05-02 NOTE — Patient Instructions (Signed)
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
## 2021-06-15 DIAGNOSIS — B353 Tinea pedis: Secondary | ICD-10-CM | POA: Diagnosis not present

## 2021-06-27 DIAGNOSIS — G4733 Obstructive sleep apnea (adult) (pediatric): Secondary | ICD-10-CM | POA: Diagnosis not present

## 2021-07-27 DIAGNOSIS — G4733 Obstructive sleep apnea (adult) (pediatric): Secondary | ICD-10-CM | POA: Diagnosis not present

## 2021-08-15 DIAGNOSIS — E663 Overweight: Secondary | ICD-10-CM | POA: Diagnosis not present

## 2021-08-27 DIAGNOSIS — G4733 Obstructive sleep apnea (adult) (pediatric): Secondary | ICD-10-CM | POA: Diagnosis not present

## 2021-09-13 DIAGNOSIS — L2089 Other atopic dermatitis: Secondary | ICD-10-CM | POA: Diagnosis not present

## 2021-11-14 DIAGNOSIS — E663 Overweight: Secondary | ICD-10-CM | POA: Diagnosis not present

## 2021-12-25 DIAGNOSIS — E8881 Metabolic syndrome: Secondary | ICD-10-CM | POA: Diagnosis not present

## 2021-12-25 DIAGNOSIS — J029 Acute pharyngitis, unspecified: Secondary | ICD-10-CM | POA: Diagnosis not present

## 2021-12-25 DIAGNOSIS — R051 Acute cough: Secondary | ICD-10-CM | POA: Diagnosis not present

## 2022-01-11 DIAGNOSIS — R6882 Decreased libido: Secondary | ICD-10-CM | POA: Diagnosis not present

## 2022-01-15 DIAGNOSIS — E785 Hyperlipidemia, unspecified: Secondary | ICD-10-CM | POA: Diagnosis not present

## 2022-01-15 DIAGNOSIS — Z125 Encounter for screening for malignant neoplasm of prostate: Secondary | ICD-10-CM | POA: Diagnosis not present

## 2022-01-18 DIAGNOSIS — R82998 Other abnormal findings in urine: Secondary | ICD-10-CM | POA: Diagnosis not present

## 2022-01-18 DIAGNOSIS — Z0000 Encounter for general adult medical examination without abnormal findings: Secondary | ICD-10-CM | POA: Diagnosis not present

## 2022-01-18 DIAGNOSIS — E785 Hyperlipidemia, unspecified: Secondary | ICD-10-CM | POA: Diagnosis not present

## 2022-02-01 DIAGNOSIS — G4733 Obstructive sleep apnea (adult) (pediatric): Secondary | ICD-10-CM | POA: Diagnosis not present

## 2022-02-21 DIAGNOSIS — E669 Obesity, unspecified: Secondary | ICD-10-CM | POA: Diagnosis not present

## 2022-02-21 DIAGNOSIS — G4733 Obstructive sleep apnea (adult) (pediatric): Secondary | ICD-10-CM | POA: Diagnosis not present

## 2022-02-21 DIAGNOSIS — E8881 Metabolic syndrome: Secondary | ICD-10-CM | POA: Diagnosis not present

## 2022-02-21 DIAGNOSIS — E785 Hyperlipidemia, unspecified: Secondary | ICD-10-CM | POA: Diagnosis not present

## 2022-03-04 DIAGNOSIS — G4733 Obstructive sleep apnea (adult) (pediatric): Secondary | ICD-10-CM | POA: Diagnosis not present

## 2022-03-21 ENCOUNTER — Telehealth: Payer: Self-pay | Admitting: Family Medicine

## 2022-03-21 DIAGNOSIS — G4733 Obstructive sleep apnea (adult) (pediatric): Secondary | ICD-10-CM

## 2022-03-21 NOTE — Telephone Encounter (Signed)
Rx printed, waiting on NP signature ?

## 2022-03-21 NOTE — Telephone Encounter (Signed)
Pt called and asked can he get a copy of his CPAP prescription. Stated he needs it to get a travel CPAP machine because he is going out the country for a [place].

## 2022-03-21 NOTE — Addendum Note (Signed)
Addended by: Wyvonnia Lora on: 03/21/2022 03:47 PM   Modules accepted: Orders

## 2022-03-21 NOTE — Telephone Encounter (Signed)
Called pt. He would like rx emailed to him at  [email or web address]   Emailed as requested.

## 2022-03-21 NOTE — Telephone Encounter (Signed)
Cole Jones, pt last seen 05/02/21. Next yearly appt scheduled for 05/08/22.   Are you ok with providing rx for travel CPAP?

## 2022-04-02 DIAGNOSIS — G4733 Obstructive sleep apnea (adult) (pediatric): Secondary | ICD-10-CM | POA: Diagnosis not present
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
## 2022-05-07 NOTE — Patient Instructions (Incomplete)
Please continue using your CPAP regularly. While your insurance requires that you use CPAP at least 4 hours each night on 70% of the nights, I recommend, that you not skip any nights and use it throughout the night if you can. Getting used to CPAP and staying with the treatment long term does take time and patience and discipline. Untreated obstructive sleep apnea when it is moderate to severe can have an adverse impact on cardiovascular health and raise her risk for heart disease, arrhythmias, hypertension, congestive heart failure, stroke and diabetes. Untreated obstructive sleep apnea causes sleep disruption, nonrestorative sleep, and sleep deprivation. This can have an impact on your day to day functioning and cause daytime sleepiness and impairment of cognitive function, memory loss, mood disturbance, and problems focussing. Using CPAP regularly can improve these symptoms.  We will update supply orders, today. I will see if we can transfer your care to [facility]. Let me know if you need me.   Follow up in 1 year

## 2022-05-08 ENCOUNTER — Telehealth: Payer: Self-pay | Admitting: *Deleted

## 2022-05-08 ENCOUNTER — Encounter: Payer: Self-pay | Admitting: Family Medicine

## 2022-05-08 ENCOUNTER — Ambulatory Visit: Payer: Federal, State, Local not specified - PPO | Admitting: Family Medicine

## 2022-05-08 VITALS — BP 137/80 | HR 62 | Ht 69.0 in | Wt 212.0 lb

## 2022-05-08 DIAGNOSIS — G4733 Obstructive sleep apnea (adult) (pediatric): Secondary | ICD-10-CM

## 2022-05-08 NOTE — Telephone Encounter (Signed)
Office notes, order faxed to [facility] [phone]

## 2022-05-08 NOTE — Telephone Encounter (Signed)
-----   Message from Shawnie Dapper, NP sent at 05/08/2022  9:45 AM EDT ----- Patient request transfer of care to [facility]. Orders placed. TY!

## 2022-05-16 DIAGNOSIS — G4733 Obstructive sleep apnea (adult) (pediatric): Secondary | ICD-10-CM | POA: Diagnosis not present

## 2022-05-30 DIAGNOSIS — E669 Obesity, unspecified: Secondary | ICD-10-CM | POA: Diagnosis not present

## 2022-07-03 DIAGNOSIS — M79645 Pain in left finger(s): Secondary | ICD-10-CM | POA: Diagnosis not present

## 2022-07-10 DIAGNOSIS — G4733 Obstructive sleep apnea (adult) (pediatric): Secondary | ICD-10-CM | POA: Diagnosis not present

## 2022-08-10 DIAGNOSIS — G4733 Obstructive sleep apnea (adult) (pediatric): Secondary | ICD-10-CM | POA: Diagnosis not present

## 2022-08-28 DIAGNOSIS — G4733 Obstructive sleep apnea (adult) (pediatric): Secondary | ICD-10-CM | POA: Diagnosis not present

## 2022-09-10 DIAGNOSIS — G4733 Obstructive sleep apnea (adult) (pediatric): Secondary | ICD-10-CM | POA: Diagnosis not present

## 2022-11-27 DIAGNOSIS — G4733 Obstructive sleep apnea (adult) (pediatric): Secondary | ICD-10-CM | POA: Diagnosis not present

## 2022-12-16 DIAGNOSIS — R0981 Nasal congestion: Secondary | ICD-10-CM | POA: Diagnosis not present

## 2023-02-04 ENCOUNTER — Other Ambulatory Visit: Payer: Self-pay

## 2023-02-04 ENCOUNTER — Telehealth: Payer: Self-pay | Admitting: Family Medicine

## 2023-02-04 DIAGNOSIS — G4733 Obstructive sleep apnea (adult) (pediatric): Secondary | ICD-10-CM

## 2023-02-04 NOTE — Telephone Encounter (Signed)
At 9:54 pt left a vm stating he was calling back to provide a list of in network DME's pt can be reached at [phone]

## 2023-02-04 NOTE — Telephone Encounter (Signed)
Pt called stating that he has new insurance [facility]) and he will be updating it in his mychart. He states that the DME he is with at the moment is out of Network and he would like to know what DME he can use now.

## 2023-02-04 NOTE — Telephone Encounter (Signed)
Called pt and he stated that he would transfer care to [facility]. Sent community messag to [employer] as well as placed new Supply order.

## 2023-02-04 NOTE — Telephone Encounter (Signed)
Called and spoke with pt. He currently uses [facility]. I offered to transition to [facility] but he prefers to not use them. He will call his new insurance to see what DME companies are in network and call back with who he wants to transition to.

## 2023-02-11 NOTE — Telephone Encounter (Signed)
 At 10:02 pt left vm that [facility] still appears to have his old insurance information([employer]) Pt states he has sent over a copy front and back of his new insurance([employer]/part of [employer]) Pt is asking if RN can reach out to DME to see what the delay is on them processing his order so he can get his needed supplies as soon as possible.

## 2023-02-11 NOTE — Telephone Encounter (Signed)
Sent urgent community message to [facility] asking they call pt with update today.

## 2023-02-18 ENCOUNTER — Encounter: Payer: Self-pay | Admitting: Gastroenterology

## 2023-03-19 ENCOUNTER — Encounter: Payer: Self-pay | Admitting: Gastroenterology

## 2023-03-28 ENCOUNTER — Other Ambulatory Visit: Payer: Self-pay

## 2023-03-28 ENCOUNTER — Other Ambulatory Visit (HOSPITAL_COMMUNITY): Payer: Self-pay

## 2023-03-28 MED ORDER — ZEPBOUND 10 MG/0.5ML ~~LOC~~ SOAJ
10.0000 mg | SUBCUTANEOUS | 3 refills | Status: AC
  Filled 2023-03-28: qty 2, 28d supply, fill #0
  Filled 2024-01-07 (×2): qty 2, 28d supply, fill #1

## 2023-03-28 MED ORDER — SERTRALINE HCL 50 MG PO TABS
50.0000 mg | ORAL_TABLET | Freq: Every day | ORAL | 3 refills | Status: DC
  Filled 2023-03-28: qty 30, 30d supply, fill #0

## 2023-04-09 ENCOUNTER — Other Ambulatory Visit (HOSPITAL_COMMUNITY): Payer: Self-pay

## 2023-04-10 ENCOUNTER — Other Ambulatory Visit (HOSPITAL_COMMUNITY): Payer: Self-pay

## 2023-04-10 MED ORDER — ZEPBOUND 12.5 MG/0.5ML ~~LOC~~ SOAJ
12.5000 mg | SUBCUTANEOUS | 0 refills | Status: DC
  Filled 2023-04-10: qty 2, 28d supply, fill #0

## 2023-04-28 ENCOUNTER — Ambulatory Visit (AMBULATORY_SURGERY_CENTER): Admitting: *Deleted

## 2023-04-28 ENCOUNTER — Other Ambulatory Visit (HOSPITAL_COMMUNITY): Payer: Self-pay

## 2023-04-28 VITALS — Ht 69.0 in | Wt 209.0 lb

## 2023-04-28 DIAGNOSIS — Z83719 Family history of colon polyps, unspecified: Secondary | ICD-10-CM

## 2023-04-28 DIAGNOSIS — Z860100 Personal history of colon polyps, unspecified: Secondary | ICD-10-CM

## 2023-04-28 MED ORDER — NA SULFATE-K SULFATE-MG SULF 17.5-3.13-1.6 GM/177ML PO SOLN
1.0000 | Freq: Once | ORAL | 0 refills | Status: AC
  Filled 2023-04-28: qty 354, 1d supply, fill #0

## 2023-04-28 NOTE — Progress Notes (Signed)
 Pre visit completed over telephone. Instructions mailed and sent through MyChart Patient verbalized understanding to hold Zepbound  7 full days before colonoscopy.  No egg or soy allergy known to patient  No issues known to pt with past sedation with any surgeries or procedures Patient denies ever being told they had issues or difficulty with intubation  No FH of Malignant Hyperthermia Pt is not on diet pills Pt is not on  home 02  Pt is not on blood thinners  Pt denies issues with constipation  No A fib or A flutter Have any cardiac testing pending--NO Pt instructed to use [email or web address] or GoodRx for a price reduction on prep

## 2023-05-05 ENCOUNTER — Encounter: Payer: Self-pay | Admitting: Gastroenterology

## 2023-05-07 NOTE — Progress Notes (Deleted)
 Cole Jones
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
## 2023-05-08 ENCOUNTER — Ambulatory Visit: Payer: Federal, State, Local not specified - PPO | Admitting: Family Medicine

## 2023-05-08 ENCOUNTER — Other Ambulatory Visit (HOSPITAL_COMMUNITY): Payer: Self-pay

## 2023-05-08 DIAGNOSIS — G4733 Obstructive sleep apnea (adult) (pediatric): Secondary | ICD-10-CM

## 2023-05-09 ENCOUNTER — Other Ambulatory Visit (HOSPITAL_COMMUNITY): Payer: Self-pay

## 2023-05-09 ENCOUNTER — Other Ambulatory Visit: Payer: Self-pay

## 2023-05-09 MED ORDER — NA SULFATE-K SULFATE-MG SULF 17.5-3.13-1.6 GM/177ML PO SOLN: 1.0000 | ORAL | 0 refills | Status: DC

## 2023-05-12 ENCOUNTER — Ambulatory Visit (AMBULATORY_SURGERY_CENTER): Admitting: Gastroenterology

## 2023-05-12 ENCOUNTER — Encounter: Payer: Self-pay | Admitting: Gastroenterology

## 2023-05-12 VITALS — BP 113/72 | HR 57 | Temp 97.9°F | Resp 10 | Ht 69.0 in | Wt 209.0 lb

## 2023-05-12 DIAGNOSIS — Z1211 Encounter for screening for malignant neoplasm of colon: Secondary | ICD-10-CM | POA: Diagnosis present

## 2023-05-12 DIAGNOSIS — K5730 Diverticulosis of large intestine without perforation or abscess without bleeding: Secondary | ICD-10-CM | POA: Diagnosis not present

## 2023-05-12 DIAGNOSIS — K6389 Other specified diseases of intestine: Secondary | ICD-10-CM

## 2023-05-12 DIAGNOSIS — K648 Other hemorrhoids: Secondary | ICD-10-CM | POA: Diagnosis not present

## 2023-05-12 DIAGNOSIS — Z860100 Personal history of colon polyps, unspecified: Secondary | ICD-10-CM

## 2023-05-12 DIAGNOSIS — Z860101 Personal history of adenomatous and serrated colon polyps: Secondary | ICD-10-CM | POA: Diagnosis not present

## 2023-05-12 MED ORDER — SODIUM CHLORIDE 0.9 % IV SOLN: 500.0000 mL | Freq: Once | INTRAVENOUS | Status: DC

## 2023-05-12 NOTE — Op Note (Signed)
 [facility] Patient Name: Cole Jones Procedure Date: 05/12/2023 11:57 AM MRN: [id number] Endoscopist: Landon Pinion P. General [place] , MD, [id number] Age: 53 Referring MD:  Date of Birth: December 25, 1970 Gender: Male Account #: [id number] Procedure:                Colonoscopy Indications:              High risk colon cancer surveillance: Personal                            history of colonic polyps - advanced adenoma                            removed 01/2020 Medicines:                Monitored Anesthesia Care Procedure:                Pre-Anesthesia Assessment:                           - Prior to the procedure, a History and Physical                            was performed, and patient medications and                            allergies were reviewed. The patient's tolerance of                            previous anesthesia was also reviewed. The risks                            and benefits of the procedure and the sedation                            options and risks were discussed with the patient.                            All questions were answered, and informed consent                            was obtained. Prior Anticoagulants: The patient has                            taken no anticoagulant or antiplatelet agents. ASA                            Grade Assessment: II - A patient with mild systemic                            disease. After reviewing the risks and benefits,                            the patient was deemed in satisfactory condition to  undergo the procedure.                           After obtaining informed consent, the colonoscope                            was passed under direct vision. Throughout the                            procedure, the patient's blood pressure, pulse, and                            oxygen saturations were monitored continuously. The                            Olympus Scope SN [id number] was introduced  through the                            anus and advanced to the the cecum, identified by                            appendiceal orifice and ileocecal valve. The                            colonoscopy was performed without difficulty. The                            patient tolerated the procedure well. The quality                            of the bowel preparation was good. The ileocecal                            valve, appendiceal orifice, and rectum were                            photographed. Scope In: 12:02:05 PM Scope Out: 12:13:33 PM Scope Withdrawal Time: 0 hours 9 minutes 42 seconds  Total Procedure Duration: 0 hours 11 minutes 28 seconds  Findings:                 The perianal and digital rectal examinations were                            normal.                           A few small-mouthed diverticula were found in the                            sigmoid colon and right colon.                           Internal hemorrhoids were found during  retroflexion. The hemorrhoids were small.                           There was mild superficial erythema in the sigmoid                            colon. Suspect prep artifact. The exam was                            otherwise without abnormality. Complications:            No immediate complications. Estimated blood loss:                            None. Estimated Blood Loss:     Estimated blood loss: none. Impression:               - Diverticulosis in the sigmoid colon and in the                            right colon.                           - Internal hemorrhoids.                           - The examination was otherwise normal.                           - No polyps. Recommendation:           - Patient has a contact number available for                            emergencies. The signs and symptoms of potential                            delayed complications were discussed with the                             patient. Return to normal activities tomorrow.                            Written discharge instructions were provided to the                            patient.                           - Resume previous diet.                           - Continue present medications.                           - Repeat colonoscopy in 5 years for surveillance  given advanced adenoma on the last exam. Landon Pinion P. Joclyn Alsobrook, MD 05/12/2023 12:19:22 PM This report has been signed electronically.
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
## 2023-05-12 NOTE — Progress Notes (Signed)
 Pt's states no medical or surgical changes since previsit or office visit.

## 2023-05-12 NOTE — Progress Notes (Signed)
 To pacu, VSS. Report to [email or web address].tb

## 2023-05-12 NOTE — Patient Instructions (Signed)
 Discharge instructions given. Handouts on Diverticulosis and Hemorrhoids. Resume previous medications. YOU HAD AN ENDOSCOPIC PROCEDURE TODAY AT THE [facility]:   Refer to the procedure report that was given to you for any specific questions about what was found during the examination.  If the procedure report does not answer your questions, please call your gastroenterologist to clarify.  If you requested that your care partner not be given the details of your procedure findings, then the procedure report has been included in a sealed envelope for you to review at your convenience later.  YOU SHOULD EXPECT: Some feelings of bloating in the abdomen. Passage of more gas than usual.  Walking can help get rid of the air that was put into your GI tract during the procedure and reduce the bloating. If you had a lower endoscopy (such as a colonoscopy or flexible sigmoidoscopy) you may notice spotting of blood in your stool or on the toilet paper. If you underwent a bowel prep for your procedure, you may not have a normal bowel movement for a few days.  Please Note:  You might notice some irritation and congestion in your nose or some drainage.  This is from the oxygen used during your procedure.  There is no need for concern and it should clear up in a day or so.  SYMPTOMS TO REPORT IMMEDIATELY:  Following lower endoscopy (colonoscopy or flexible sigmoidoscopy):  Excessive amounts of blood in the stool  Significant tenderness or worsening of abdominal pains  Swelling of the abdomen that is new, acute  Fever of 100F or higher   For urgent or emergent issues, a gastroenterologist can be reached at any hour by calling [phone]. Do not use MyChart messaging for urgent concerns.    DIET:  We do recommend a small meal at first, but then you may proceed to your regular diet.  Drink plenty of fluids but you should avoid alcoholic beverages for 24 hours.  ACTIVITY:  You should plan to  take it easy for the rest of today and you should NOT DRIVE or use heavy machinery until tomorrow (because of the sedation medicines used during the test).    FOLLOW UP: Our staff will call the number listed on your records the next business day following your procedure.  We will call around 7:15- 8:00 am to check on you and address any questions or concerns that you may have regarding the information given to you following your procedure. If we do not reach you, we will leave a message.     If any biopsies were taken you will be contacted by phone or by letter within the next 1-3 weeks.  Please call us at [phone] if you have not heard about the biopsies in 3 weeks.    SIGNATURES/CONFIDENTIALITY: You and/or your care partner have signed paperwork which will be entered into your electronic medical record.  These signatures attest to the fact that that the information above on your After Visit Summary has been reviewed and is understood.  Full responsibility of the confidentiality of this discharge information lies with you and/or your care-partner.

## 2023-05-13 ENCOUNTER — Telehealth: Payer: Self-pay

## 2023-05-13 ENCOUNTER — Other Ambulatory Visit (HOSPITAL_COMMUNITY): Payer: Self-pay

## 2023-05-13 MED ORDER — ZEPBOUND 12.5 MG/0.5ML ~~LOC~~ SOAJ
12.5000 mg | SUBCUTANEOUS | 0 refills | Status: DC
  Filled 2023-05-13 – 2023-05-15 (×2): qty 2, 28d supply, fill #0

## 2023-05-13 NOTE — Telephone Encounter (Signed)
  Follow up Call-     05/12/2023   10:46 AM  Call back number  Post procedure Call Back phone  # [phone]  Permission to leave phone message Yes     Patient questions:  Do you have a fever, pain , or abdominal swelling? No. Pain Score  0 *  Have you tolerated food without any problems? Yes.    Have you been able to return to your normal activities? Yes.    Do you have any questions about your discharge instructions: Diet   No. Medications  No. Follow up visit  No.  Do you have questions or concerns about your Care? No.  Actions: * If pain score is 4 or above: No action needed, pain <4.

## 2023-05-15 ENCOUNTER — Other Ambulatory Visit (HOSPITAL_COMMUNITY): Payer: Self-pay

## 2023-05-16 ENCOUNTER — Other Ambulatory Visit (HOSPITAL_COMMUNITY): Payer: Self-pay

## 2023-05-23 ENCOUNTER — Other Ambulatory Visit: Payer: Self-pay

## 2023-05-23 MED ORDER — ZEPBOUND 12.5 MG/0.5ML ~~LOC~~ SOAJ
12.5000 mg | SUBCUTANEOUS | 3 refills | Status: AC
Start: 2023-05-23 — End: ?
  Filled 2023-05-23 – 2023-06-17 (×3): qty 2, 28d supply, fill #0
  Filled 2023-07-18: qty 2, 28d supply, fill #1
  Filled 2023-08-15: qty 2, 28d supply, fill #2
  Filled 2023-10-06: qty 2, 28d supply, fill #3

## 2023-06-05 ENCOUNTER — Telehealth: Payer: Self-pay | Admitting: Family Medicine

## 2023-06-05 NOTE — Telephone Encounter (Signed)
 Pt called to discuss text message notification there was an earlier appointment available and was told to go to MyChart to see the offer. Patient said there was a message earlier appointment was no longer available. Patient voiced his frustration with waitlist process.

## 2023-06-12 ENCOUNTER — Other Ambulatory Visit: Payer: Self-pay

## 2023-06-17 ENCOUNTER — Other Ambulatory Visit (HOSPITAL_COMMUNITY): Payer: Self-pay

## 2023-06-18 ENCOUNTER — Other Ambulatory Visit: Payer: Self-pay

## 2023-06-30 ENCOUNTER — Ambulatory Visit: Admitting: Family Medicine

## 2023-06-30 ENCOUNTER — Encounter: Payer: Self-pay | Admitting: Family Medicine

## 2023-06-30 VITALS — BP 144/80 | HR 63 | Ht 69.0 in | Wt 202.5 lb

## 2023-06-30 DIAGNOSIS — E781 Pure hyperglyceridemia: Secondary | ICD-10-CM | POA: Insufficient documentation

## 2023-06-30 DIAGNOSIS — G4733 Obstructive sleep apnea (adult) (pediatric): Secondary | ICD-10-CM | POA: Diagnosis not present

## 2023-06-30 DIAGNOSIS — F419 Anxiety disorder, unspecified: Secondary | ICD-10-CM | POA: Insufficient documentation
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
## 2023-06-30 NOTE — Patient Instructions (Signed)

## 2023-07-18 ENCOUNTER — Other Ambulatory Visit (HOSPITAL_COMMUNITY): Payer: Self-pay

## 2023-07-23 ENCOUNTER — Other Ambulatory Visit: Payer: Self-pay

## 2023-07-23 MED ORDER — ZEPBOUND 12.5 MG/0.5ML ~~LOC~~ SOAJ
12.5000 mg | SUBCUTANEOUS | 5 refills | Status: AC
  Filled 2023-11-06: qty 2, 28d supply, fill #0
  Filled 2023-12-01: qty 2, 28d supply, fill #1

## 2023-07-29 ENCOUNTER — Ambulatory Visit: Admitting: Family Medicine

## 2023-08-15 ENCOUNTER — Other Ambulatory Visit (HOSPITAL_COMMUNITY): Payer: Self-pay

## 2023-09-12 ENCOUNTER — Other Ambulatory Visit (HOSPITAL_COMMUNITY): Payer: Self-pay

## 2023-09-12 ENCOUNTER — Other Ambulatory Visit: Payer: Self-pay

## 2023-09-12 MED ORDER — ZEPBOUND 12.5 MG/0.5ML ~~LOC~~ SOAJ
12.5000 mg | SUBCUTANEOUS | 0 refills | Status: AC
  Filled 2023-09-12 (×3): qty 2, 28d supply, fill #0

## 2023-09-15 ENCOUNTER — Other Ambulatory Visit: Payer: Self-pay

## 2023-11-06 ENCOUNTER — Other Ambulatory Visit: Payer: Self-pay

## 2023-11-07 ENCOUNTER — Other Ambulatory Visit (HOSPITAL_COMMUNITY): Payer: Self-pay

## 2023-11-07 ENCOUNTER — Other Ambulatory Visit: Payer: Self-pay

## 2023-11-27 ENCOUNTER — Other Ambulatory Visit: Payer: Self-pay

## 2023-11-27 MED ORDER — TIRZEPATIDE-WEIGHT MANAGEMENT 10 MG/0.5ML ~~LOC~~ SOAJ
10.0000 mg | SUBCUTANEOUS | 0 refills | Status: AC
  Filled 2023-12-01 – 2023-12-03 (×3): qty 2, 28d supply, fill #0

## 2023-12-01 ENCOUNTER — Other Ambulatory Visit: Payer: Self-pay

## 2023-12-03 ENCOUNTER — Other Ambulatory Visit: Payer: Self-pay

## 2023-12-03 ENCOUNTER — Other Ambulatory Visit (HOSPITAL_COMMUNITY): Payer: Self-pay

## 2024-01-07 ENCOUNTER — Other Ambulatory Visit (HOSPITAL_COMMUNITY): Payer: Self-pay

## 2024-01-07 ENCOUNTER — Other Ambulatory Visit: Payer: Self-pay

## 2024-02-04 ENCOUNTER — Other Ambulatory Visit: Payer: Self-pay

## 2024-02-04 MED ORDER — WEGOVY 2.4 MG/0.75ML ~~LOC~~ SOAJ
SUBCUTANEOUS | 0 refills | Status: DC
  Filled 2024-02-04: qty 3, 28d supply, fill #0

## 2024-02-09 ENCOUNTER — Other Ambulatory Visit: Payer: Self-pay

## 2024-02-09 MED ORDER — ZEPBOUND 10 MG/0.5ML ~~LOC~~ SOAJ
10.0000 mg | SUBCUTANEOUS | 1 refills | Status: AC
  Filled 2024-02-09: qty 2, 28d supply, fill #0

## 2024-02-12 ENCOUNTER — Other Ambulatory Visit: Payer: Self-pay

## 2024-02-13 ENCOUNTER — Other Ambulatory Visit: Payer: Self-pay

## 2024-07-06 ENCOUNTER — Ambulatory Visit: Admitting: Family Medicine
